# Patient Record
Sex: Female | Born: 1975 | Hispanic: No | Smoking: Never smoker
Health system: Southern US, Community
[De-identification: ages and names within clinical notes are randomized; demographics above are authoritative.]

## PROBLEM LIST (undated history)

## (undated) ENCOUNTER — Inpatient Hospital Stay (HOSPITAL_COMMUNITY): Payer: Self-pay

## (undated) DIAGNOSIS — Z789 Other specified health status: Secondary | ICD-10-CM

---

## 2015-03-20 ENCOUNTER — Inpatient Hospital Stay (HOSPITAL_COMMUNITY)
Admission: AD | Admit: 2015-03-20 | Discharge: 2015-03-21 | Disposition: A | Discharge status: Home / Self Care | Payer: Medicaid Other | Source: Ambulatory Visit | Attending: Obstetrics and Gynecology | Admitting: Obstetrics and Gynecology

## 2015-03-20 ENCOUNTER — Encounter (HOSPITAL_COMMUNITY): Payer: Self-pay | Admitting: *Deleted

## 2015-03-20 ENCOUNTER — Inpatient Hospital Stay (HOSPITAL_COMMUNITY): Payer: Medicaid Other

## 2015-03-20 DIAGNOSIS — O209 Hemorrhage in early pregnancy, unspecified: Secondary | ICD-10-CM | POA: Diagnosis present

## 2015-03-20 DIAGNOSIS — O4690 Antepartum hemorrhage, unspecified, unspecified trimester: Secondary | ICD-10-CM

## 2015-03-20 DIAGNOSIS — O034 Incomplete spontaneous abortion without complication: Secondary | ICD-10-CM | POA: Diagnosis not present

## 2015-03-20 HISTORY — DX: Other specified health status: Z78.9

## 2015-03-20 LAB — CBC
HCT: 34.5 % — ABNORMAL LOW (ref 36.0–46.0)
Hemoglobin: 12.2 g/dL (ref 12.0–15.0)
MCH: 31.5 pg (ref 26.0–34.0)
MCHC: 35.4 g/dL (ref 30.0–36.0)
MCV: 89.1 fL (ref 78.0–100.0)
Platelets: 186 10*3/uL (ref 150–400)
RBC: 3.87 MIL/uL (ref 3.87–5.11)
RDW: 12.4 % (ref 11.5–15.5)
WBC: 7.9 10*3/uL (ref 4.0–10.5)

## 2015-03-20 NOTE — MAU Provider Note (Signed)
History     CSN: [id number]  Arrival date and time: 03/20/15 2245   None     No chief complaint on file.  HPI 39 y.o. [id number]  presents by EMS with heavy bleeding. language Barrier. Interpreter by phone used to obtain history. Pt states she was in more pain earlier but it was better now.  No past medical history on file.  No past surgical history on file.  No family history on file.  History  Substance Use Topics  . Smoking status: Not on file  . Smokeless tobacco: Not on file  . Alcohol Use: Not on file    Allergies: Allergies not on file  No prescriptions prior to admission    Review of Systems  Genitourinary:       Heavy vaginal bleeding  All other systems reviewed and are negative.  Physical Exam   Blood pressure 123/71, pulse 82, temperature 98 F (36.7 C), temperature source Oral, resp. rate 18, last menstrual period 12/28/2014.  Physical Exam  Nursing note and vitals reviewed. Constitutional: She is oriented to person, place, and time. She appears well-developed and well-nourished. No distress.  HENT:  Head: Normocephalic.  Neck: Normal range of motion.  Cardiovascular: Normal rate.   Respiratory: Effort normal. No respiratory distress.  GI: Soft.  Genitourinary: There is bleeding in the vagina.  Musculoskeletal: Normal range of motion.  Neurological: She is alert and oriented to person, place, and time.  Skin: Skin is warm and dry.  Psychiatric: She has a normal mood and affect. Her behavior is normal. Judgment and thought content normal.   US Ob Comp Less 14 Wks  03/21/2015   CLINICAL DATA:  39 year old female with heavy vaginal bleeding in the first trimester of pregnancy. Pain. Estimated gestational age by LMP 11 weeks 5 days  EXAM: OBSTETRIC <14 WK [place] AND TRANSVAGINAL OB US  TECHNIQUE: Both transabdominal and transvaginal ultrasound examinations were performed for complete evaluation of the gestation as well as the maternal uterus, adnexal  regions, and pelvic cul-de-sac. Transvaginal technique was performed to assess early pregnancy.  COMPARISON:  None.  FINDINGS: Intrauterine gestational sac: Single, located at the lower uterine segment and cervix  Yolk sac:  Not visible  Embryo:  Visible  Cardiac Activity: Not detected. No vascularity detected with power Doppler.  MSD: 46.8  mm   10 w   4  d  CRL:  17.9  mm   8 w   2 d  Maternal uterus/adnexae: At adnexa not identified. No pelvic free fluid. No subchorionic hemorrhage.  IMPRESSION: Visible embryo but with absent cardiac activity and abnormally low position of the intrauterine gestational sac suspicious for spontaneous abortion in progress.   Electronically Signed   By: Odessa Fleming M.D.   On: 03/21/2015 00:18   US Ob Transvaginal  03/21/2015   CLINICAL DATA:  39 year old female with heavy vaginal bleeding in the first trimester of pregnancy. Pain. Estimated gestational age by LMP 11 weeks 5 days  EXAM: OBSTETRIC <14 WK [place] AND TRANSVAGINAL OB US  TECHNIQUE: Both transabdominal and transvaginal ultrasound examinations were performed for complete evaluation of the gestation as well as the maternal uterus, adnexal regions, and pelvic cul-de-sac. Transvaginal technique was performed to assess early pregnancy.  COMPARISON:  None.  FINDINGS: Intrauterine gestational sac: Single, located at the lower uterine segment and cervix  Yolk sac:  Not visible  Embryo:  Visible  Cardiac Activity: Not detected. No vascularity detected with power Doppler.  MSD: 46.8  mm  10 w   4  d  CRL:  17.9  mm   8 w   2 d  Maternal uterus/adnexae: At adnexa not identified. No pelvic free fluid. No subchorionic hemorrhage.  IMPRESSION: Visible embryo but with absent cardiac activity and abnormally low position of the intrauterine gestational sac suspicious for spontaneous abortion in progress.   Electronically Signed   By: Odessa Fleming M.D.   On: 03/21/2015 00:18   Results for orders placed or performed during the hospital encounter of  03/20/15 (from the past 24 hour(s))  CBC     Status: Abnormal   Collection Time: 03/20/15 11:25 PM  Result Value Ref Range   WBC 7.9 4.0 - 10.5 K/uL   RBC 3.87 3.87 - 5.11 MIL/uL   Hemoglobin 12.2 12.0 - 15.0 g/dL   HCT 29.9 (L) 24.2 - 68.3 %   MCV 89.1 78.0 - 100.0 fL   MCH 31.5 26.0 - 34.0 pg   MCHC 35.4 30.0 - 36.0 g/dL   RDW 41.9 62.2 - 29.7 %   Platelets 186 150 - 400 K/uL  ABO/Rh     Status: None (Preliminary result)   Collection Time: 03/20/15 11:25 PM  Result Value Ref Range   ABO/RH(D) O POS    MAU Course  Procedures  MDM Probable miscarriage; Removed 2 large clots from vagina and not actively bleeding. Labs ordered ; Ultrasound ordered. Pt is currently stable. Pt offered cytotec and is agreeable. Discussed POC with Dr Jolayne Panther. Early Intrauterine Pregnancy Failure Protocol  X Documented intrauterine pregnancy failure less than or equal to [redacted] weeks gestation  X No serious current illness  X Baseline Hgb greater than or equal to 10g/dl  X Patient has easily accessible transportation to the hospital  X Clear preference  X Practitioner/physician deems patient reliable  X Counseling by practitioner or physician  X Patient education by RN  X Consent form signed  Rho-Gam given by RN if indicated  X Medication dispensed  __ Cytotec 800 mcg Intravaginally by patient at home  X Buccally by NP in MAU  Rectally by patient at home  Rectally by RN in MAU  X Ibuprofen 600 mg 1 tablet by mouth every 6 hours as needed #30 - prescribed  X Tylenol #3 mg by mouth every 4 to 6 hours as needed - prescribed  _x_ Phenergan 12.5 mg by mouth every 4 hours as needed for nausea - prescribed     Assessment and Plan  Incomplete Spontaneous Abortion  Cytotec protocol Bleeding Precautions Discharge to home   Gina Li 03/20/2015, 11:12 PM

## 2015-03-20 NOTE — MAU Note (Signed)
Pt arrived via EMS with c/o vaginal bleeding. About 8-[redacted] week pregnant. Pt speaks African dialect. Friend to interpret and Phone interpretor on line as well.Pt had brief on and had moderate sized clot and active bleeding noted. Called L.Clemmons, CNM to bedside. Pt reports only having mild cramping now. Stated she dis pass something at home in toilet.

## 2015-03-21 LAB — HIV ANTIBODY (ROUTINE TESTING W REFLEX): HIV Screen 4th Generation wRfx: NONREACTIVE

## 2015-03-21 LAB — ABO/RH: ABO/RH(D): O POS

## 2015-03-21 LAB — HCG, QUANTITATIVE, PREGNANCY: hCG, Beta Chain, Quant, S: 2174 m[IU]/mL — ABNORMAL HIGH (ref <5)

## 2015-03-21 MED ORDER — IBUPROFEN 600 MG PO TABS: 600.0000 mg | ORAL_TABLET | Freq: Four times a day (QID) | ORAL | Status: DC | PRN

## 2015-03-21 MED ORDER — ACETAMINOPHEN-CODEINE #3 300-30 MG PO TABS: 1.0000 | ORAL_TABLET | ORAL | Status: DC | PRN

## 2015-03-21 MED ORDER — PROMETHAZINE HCL 25 MG PO TABS: 25.0000 mg | ORAL_TABLET | Freq: Four times a day (QID) | ORAL | Status: DC | PRN

## 2015-03-21 MED ORDER — MISOPROSTOL 200 MCG PO TABS
800.0000 ug | ORAL_TABLET | Freq: Once | ORAL | Status: AC
  Administered 2015-03-21: 800 ug via ORAL
  Filled 2015-03-21: qty 4

## 2015-03-21 NOTE — MAU Note (Signed)
Cytotec instructions given to pt and friend (interpertor) . Pt placed cytotec between her cheek and gum and let it dissolve. Gave follow up instructions and precautions to pt she verbalized understanding.

## 2015-03-21 NOTE — Discharge Instructions (Signed)
FACTS YOU SHOULD KNOW  WHAT IS AN EARLY PREGNANCY FAILURE? Once the egg is fertilized with the sperm and begins to develop, it attaches to the lining of the uterus. This early pregnancy tissue may not develop into an embryo (the beginning stage of a baby). Sometimes an embryo does develop but does not continue to grow. These problems can be seen on ultrasound.   MANAGEMNT OF EARLY PREGNANCY FAILURE: About 4 out of 100 (0.25%) women will have a pregnancy loss in her lifetime.  One in five pregnancies is found to be an early pregnancy failure.  There are 3 ways to care for an early pregnancy failure:   (1) Surgery, (2) Medicine, (3) Waiting for you to pass the pregnancy on your own. The decision as to how to proceed after being diagnosed with and early pregnancy failure is an individual one.  The decision can be made only after appropriate counseling.  You need to weigh the pros and cons of the 3 choices. Then you can make the choice that works for you. SURGERY (D&E) . Procedure over in 1 day . Requires being put to sleep . Bleeding may be light . Possible problems during surgery, including injury to womb(uterus) . Care provider has more control Medicine (CYTOTEC) . The complete procedure may take days to weeks . No Surgery . Bleeding may be heavy at times . There may be drug side effects . Patient has more control Waiting . You may choose to wait, in which case your own body may complete the passing of the abnormal early pregnancy on its own in about 2-4 weeks . Your bleeding may be heavy at times . There is a small possibility that you may need surgery if the bleeding is too much or not all of the pregnancy has passed. CYTOTEC MANAGEMENT Prostaglandins (cytotec) are the most widely used drug for this purpose. They cause the uterus to cramp and contract. You will place the medicine yourself inside your vagina in the privacy of your home. Empting of the uterus should occur within 3 days but  the process may continue for several weeks. The bleeding may seem heavy at times. POSSIBLE SIDE EFFECTS FROM CYTOTEC . Nausea   Vomiting . Diarrhea Fever . Chills  Hot Flashes Side effects  from the process of the early pregnancy failure include: . Cramping  Bleeding . Headaches  Dizziness RISKS: This is a low risk procedure. Less than 1 in 100 women has a complication. An incomplete passage of the early pregnancy may occur. Also, Hemorrhage (heavy bleeding) could happen.  Rarely the pregnancy will not be passed completely. Excessively heavy bleeding may occur.  Your doctor may need to perform surgery to empty the uterus (D&E). Afterwards: Everybody will feel differently after the early pregnancy completion. You may have soreness or cramps for a day or two. You may have soreness or cramps for day or two.  You may have light bleeding for up to 2 weeks. You may be as active as you feel like being. If you have any of the following problems you may call [facility]missions [facility] at [phone]. . If you have pain that does not get better  with pain medication . Bleeding that soaks through 2 thick full-sized sanitary pads in an hour . Cramps that last longer than 2 days . Foul smelling discharge . Fever above 100.4 degrees F Even if you do not have any of these symptoms, you should have a follow-up exam to make sure you   are healing properly. This appointment will be made for you before you leave the hospital. Your next normal period will start again in 4-6 week after the loss. You can get pregnant soon after the loss, so use birth control right away. Finally: Make sure all your questions are answered before during and after any procedure. Follow up with medical care and family planning methods.     Incomplete Miscarriage A miscarriage is the sudden loss of an unborn baby (fetus) before the 20th week of pregnancy. In an incomplete miscarriage, parts of the fetus or placenta (afterbirth) remain in  the body.  Having a miscarriage can be an emotional experience. Talk with your health care provider about any questions you may have about miscarrying, the grieving process, and your future pregnancy plans. CAUSES   Problems with the fetal chromosomes that make it impossible for the baby to develop normally. Problems with the baby's genes or chromosomes are most often the result of errors that occur by chance as the embryo divides and grows. The problems are not inherited from the parents.  Infection of the cervix or uterus.  Hormone problems.  Problems with the cervix, such as having an incompetent cervix. This is when the tissue in the cervix is not strong enough to hold the pregnancy.  Problems with the uterus, such as an abnormally shaped uterus, uterine fibroids, or congenital abnormalities.  Certain medical conditions.  Smoking, drinking alcohol, or taking illegal drugs.  Trauma. SYMPTOMS   Vaginal bleeding or spotting, with or without cramps or pain.  Pain or cramping in the abdomen or lower back.  Passing fluid, tissue, or blood clots from the vagina. DIAGNOSIS  Your health care provider will perform a physical exam. You may also have an ultrasound to confirm the miscarriage. Blood or urine tests may also be ordered. TREATMENT   Usually, a dilation and curettage (D&C) procedure is performed. During a D&C procedure, the cervix is widened (dilated) and any remaining fetal or placental tissue is gently removed from the uterus.  Antibiotic medicines are prescribed if there is an infection. Other medicines may be given to reduce the size of the uterus (contract) if there is a lot of bleeding.  If you have Rh negative blood and your baby was Rh positive, you will need a Rho (D) immune globulin shot. This shot will protect any future baby from having Rh blood problems in future pregnancies.  You may be confined to bed rest. This means you should stay in bed and only get up to use  the bathroom. HOME CARE INSTRUCTIONS   Rest as directed by your health care provider.  Restrict activity as directed by your health care provider. You may be allowed to continue light activity if curettage was not done but you require further treatment.  Keep track of the number of pads you use each day. Keep track of how soaked (saturated) they are. Record this information.  Do not  use tampons.  Do not douche or have sexual intercourse until approved by your health care provider.  Keep all follow-up appointments for reevaluation and continuing management.  Only take over-the-counter or prescription medicines for pain, fever, or discomfort as directed by your health care provider.  Take antibiotic medicine as directed by your health care provider. Make sure you finish it even if you start to feel better. SEEK IMMEDIATE MEDICAL CARE IF:   You experience severe cramps in your stomach, back, or abdomen.  You have an unexplained temperature (make  sure to record these temperatures).  You pass large clots or tissue (save these for your health care provider to inspect).  Your bleeding increases.  You become light-headed, weak, or have fainting episodes. MAKE SURE YOU:   Understand these instructions.  Will watch your condition.  Will get help right away if you are not doing well or get worse. Document Released: 09/18/2005 Document Revised: 02/02/2014 Document Reviewed: 04/17/2013 [facility] Patient Information 2015 [place], [place]. This information is not intended to replace advice given to you by your health care provider. Make sure you discuss any questions you have with your health care provider.

## 2015-04-08 ENCOUNTER — Ambulatory Visit (INDEPENDENT_AMBULATORY_CARE_PROVIDER_SITE_OTHER): Payer: Medicaid Other | Admitting: Obstetrics & Gynecology

## 2015-04-08 ENCOUNTER — Encounter: Payer: Self-pay | Admitting: Obstetrics & Gynecology

## 2015-04-08 VITALS — BP 138/82 | HR 89 | Wt 160.0 lb

## 2015-04-08 DIAGNOSIS — N949 Unspecified condition associated with female genital organs and menstrual cycle: Secondary | ICD-10-CM | POA: Diagnosis not present

## 2015-04-08 DIAGNOSIS — R102 Pelvic and perineal pain: Secondary | ICD-10-CM

## 2015-04-08 DIAGNOSIS — O021 Missed abortion: Secondary | ICD-10-CM | POA: Diagnosis not present

## 2015-04-08 MED ORDER — IBUPROFEN 600 MG PO TABS: 600.0000 mg | ORAL_TABLET | Freq: Four times a day (QID) | ORAL | Status: DC | PRN

## 2015-04-08 NOTE — Patient Instructions (Signed)
Contraception Choices Contraception (birth control) is the use of any methods or devices to prevent pregnancy. Below are some methods to help avoid pregnancy. HORMONAL METHODS   Contraceptive implant. This is a thin, plastic tube containing progesterone hormone. It does not contain estrogen hormone. Your health care provider inserts the tube in the inner part of the upper arm. The tube can remain in place for up to 3 years. After 3 years, the implant must be removed. The implant prevents the ovaries from releasing an egg (ovulation), thickens the cervical mucus to prevent sperm from entering the uterus, and thins the lining of the inside of the uterus.  Progesterone-only injections. These injections are given every 3 months by your health care provider to prevent pregnancy. This synthetic progesterone hormone stops the ovaries from releasing eggs. It also thickens cervical mucus and changes the uterine lining. This makes it harder for sperm to survive in the uterus.  Birth control pills. These pills contain estrogen and progesterone hormone. They work by preventing the ovaries from releasing eggs (ovulation). They also cause the cervical mucus to thicken, preventing the sperm from entering the uterus. Birth control pills are prescribed by a health care provider.Birth control pills can also be used to treat heavy periods.  Minipill. This type of birth control pill contains only the progesterone hormone. They are taken every day of each month and must be prescribed by your health care provider.  Birth control patch. The patch contains hormones similar to those in birth control pills. It must be changed once a week and is prescribed by a health care provider.  Vaginal ring. The ring contains hormones similar to those in birth control pills. It is left in the vagina for 3 weeks, removed for 1 week, and then a new one is put back in place. The patient must be comfortable inserting and removing the ring  from the vagina.A health care provider's prescription is necessary.  Emergency contraception. Emergency contraceptives prevent pregnancy after unprotected sexual intercourse. This pill can be taken right after sex or up to 5 days after unprotected sex. It is most effective the sooner you take the pills after having sexual intercourse. Most emergency contraceptive pills are available without a prescription. Check with your pharmacist. Do not use emergency contraception as your only form of birth control. BARRIER METHODS   Female condom. This is a thin sheath (latex or rubber) that is worn over the penis during sexual intercourse. It can be used with spermicide to increase effectiveness.  Female condom. This is a soft, loose-fitting sheath that is put into the vagina before sexual intercourse.  Diaphragm. This is a soft, latex, dome-shaped barrier that must be fitted by a health care provider. It is inserted into the vagina, along with a spermicidal jelly. It is inserted before intercourse. The diaphragm should be left in the vagina for 6 to 8 hours after intercourse.  Cervical cap. This is a round, soft, latex or plastic cup that fits over the cervix and must be fitted by a health care provider. The cap can be left in place for up to 48 hours after intercourse.  Sponge. This is a soft, circular piece of polyurethane foam. The sponge has spermicide in it. It is inserted into the vagina after wetting it and before sexual intercourse.  Spermicides. These are chemicals that kill or block sperm from entering the cervix and uterus. They come in the form of creams, jellies, suppositories, foam, or tablets. They do not require a   prescription. They are inserted into the vagina with an applicator before having sexual intercourse. The process must be repeated every time you have sexual intercourse. INTRAUTERINE CONTRACEPTION  Intrauterine device (IUD). This is a T-shaped device that is put in a woman's uterus  during a menstrual period to prevent pregnancy. There are 2 types:  Copper IUD. This type of IUD is wrapped in copper wire and is placed inside the uterus. Copper makes the uterus and fallopian tubes produce a fluid that kills sperm. It can stay in place for 10 years.  Hormone IUD. This type of IUD contains the hormone progestin (synthetic progesterone). The hormone thickens the cervical mucus and prevents sperm from entering the uterus, and it also thins the uterine lining to prevent implantation of a fertilized egg. The hormone can weaken or kill the sperm that get into the uterus. It can stay in place for 3-5 years, depending on which type of IUD is used. PERMANENT METHODS OF CONTRACEPTION  Female tubal ligation. This is when the woman's fallopian tubes are surgically sealed, tied, or blocked to prevent the egg from traveling to the uterus.  Hysteroscopic sterilization. This involves placing a small coil or insert into each fallopian tube. Your doctor uses a technique called hysteroscopy to do the procedure. The device causes scar tissue to form. This results in permanent blockage of the fallopian tubes, so the sperm cannot fertilize the egg. It takes about 3 months after the procedure for the tubes to become blocked. You must use another form of birth control for these 3 months.  Female sterilization. This is when the female has the tubes that carry sperm tied off (vasectomy).This blocks sperm from entering the vagina during sexual intercourse. After the procedure, the man can still ejaculate fluid (semen). NATURAL PLANNING METHODS  Natural family planning. This is not having sexual intercourse or using a barrier method (condom, diaphragm, cervical cap) on days the woman could become pregnant.  Calendar method. This is keeping track of the length of each menstrual cycle and identifying when you are fertile.  Ovulation method. This is avoiding sexual intercourse during ovulation.  Symptothermal  method. This is avoiding sexual intercourse during ovulation, using a thermometer and ovulation symptoms.  Post-ovulation method. This is timing sexual intercourse after you have ovulated. Regardless of which type or method of contraception you choose, it is important that you use condoms to protect against the transmission of sexually transmitted infections (STIs). Talk with your health care provider about which form of contraception is most appropriate for you. Document Released: 09/18/2005 Document Revised: 09/23/2013 Document Reviewed: 03/13/2013 [employer] Patient Information 2015 [employer]. This information is not intended to replace advice given to you by your health care provider. Make sure you discuss any questions you have with your health care provider.  

## 2015-04-08 NOTE — Progress Notes (Signed)
Patient ID: Gina Li, female   DOB: 11-Nov-1975, 39 y.o.   MRN: [id number] History:  39 y.o. [id number] here today for f/u missed AB at 8 weeks.  Pt took cytotec March 21, 2015.  She reports that she took it that night and the bleeding stopped after passage of a large clot then the bleeding stopped.  She denies bleeding since at least 1 1/2 weeks prev.  Pt has mild abd pain still but has not taken any meds for it.  She reports that this feels like gas pain and is different from the pain assoc with the SAB.  The following portions of the patient's history were reviewed and updated as appropriate: allergies, current medications, past family history, past medical history, past social history, past surgical history and problem list.  Review of Systems:  A comprehensive review of systems was negative.  Objective:  Physical Exam Blood pressure 138/82, pulse 89, weight 160 lb (72.576 kg), last menstrual period 12/28/2014, unknown if currently breastfeeding. Gen: NAD Abd: Soft, nontender and nondistended; no rebound or guarding.  Well healed vertical skin incision.   Labs and Imaging [place] Ob Comp Less 14 Wks  03/21/2015   CLINICAL DATA:  39 year old female with heavy vaginal bleeding in the first trimester of pregnancy. Pain. Estimated gestational age by LMP 11 weeks 5 days  EXAM: OBSTETRIC <14 WK US AND TRANSVAGINAL OB US  TECHNIQUE: Both transabdominal and transvaginal ultrasound examinations were performed for complete evaluation of the gestation as well as the maternal uterus, adnexal regions, and pelvic cul-de-sac. Transvaginal technique was performed to assess early pregnancy.  COMPARISON:  None.  FINDINGS: Intrauterine gestational sac: Single, located at the lower uterine segment and cervix  Yolk sac:  Not visible  Embryo:  Visible  Cardiac Activity: Not detected. No vascularity detected with power Doppler.  MSD: 46.8  mm   10 w   4  d  CRL:  17.9  mm   8 w   2 d  Maternal uterus/adnexae: At  adnexa not identified. No pelvic free fluid. No subchorionic hemorrhage.  IMPRESSION: Visible embryo but with absent cardiac activity and abnormally low position of the intrauterine gestational sac suspicious for spontaneous abortion in progress.   Electronically Signed   By: Odessa FlemingH  Hall M.D.   On: 03/21/2015 00:18   [place] Ob Transvaginal  03/21/2015   CLINICAL DATA:  39 year old female with heavy vaginal bleeding in the first trimester of pregnancy. Pain. Estimated gestational age by LMP 11 weeks 5 days  EXAM: OBSTETRIC <14 WK US AND TRANSVAGINAL OB US  TECHNIQUE: Both transabdominal and transvaginal ultrasound examinations were performed for complete evaluation of the gestation as well as the maternal uterus, adnexal regions, and pelvic cul-de-sac. Transvaginal technique was performed to assess early pregnancy.  COMPARISON:  None.  FINDINGS: Intrauterine gestational sac: Single, located at the lower uterine segment and cervix  Yolk sac:  Not visible  Embryo:  Visible  Cardiac Activity: Not detected. No vascularity detected with power Doppler.  MSD: 46.8  mm   10 w   4  d  CRL:  17.9  mm   8 w   2 d  Maternal uterus/adnexae: At adnexa not identified. No pelvic free fluid. No subchorionic hemorrhage.  IMPRESSION: Visible embryo but with absent cardiac activity and abnormally low position of the intrauterine gestational sac suspicious for spontaneous abortion in progress.   Electronically Signed   By: Odessa FlemingH  Hall M.D.   On: 03/21/2015 00:18    Assessment &  Plan:  S/p cytotec for Missed AB. undesired fertility- reviewed contraceptive options. Pt will use abstinence Motrin  po prn  F/u prn

## 2016-07-05 ENCOUNTER — Ambulatory Visit: Payer: Medicaid Other | Admitting: Internal Medicine

## 2016-10-15 IMAGING — US US OB COMP LESS 14 WK
1 series · 14 of 28 positions shown · non-contrast
Comparison: None.

CLINICAL DATA: 38-year-old female with heavy vaginal bleeding in
the first trimester of pregnancy. Pain. Estimated gestational age by
LMP 11 weeks 5 days

EXAM:
OBSTETRIC <14 WK US AND TRANSVAGINAL OB US
TECHNIQUE: Both transabdominal and transvaginal ultrasound examinations were
performed for complete evaluation of the gestation as well as the
maternal uterus, adnexal regions, and pelvic cul-de-sac.
Transvaginal technique was performed to assess early pregnancy.

[Series 1: us ob comp less 14 wk · 31 acquisitions, 14 frames shown]
[im 2/31]
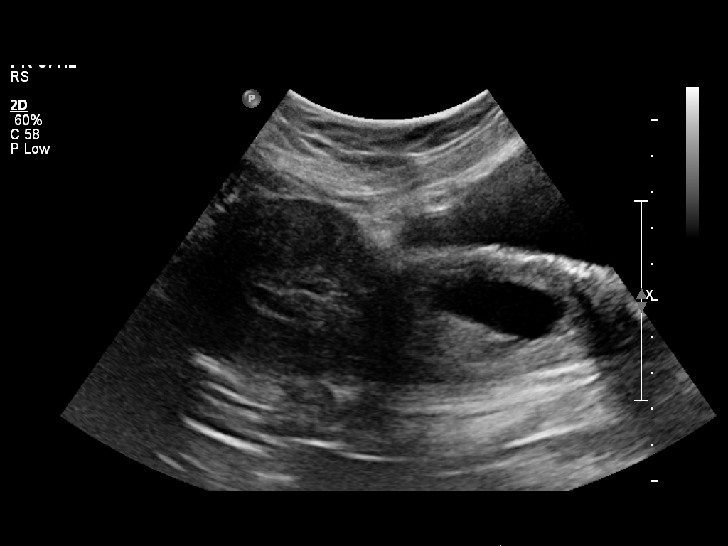
[im 4/31]
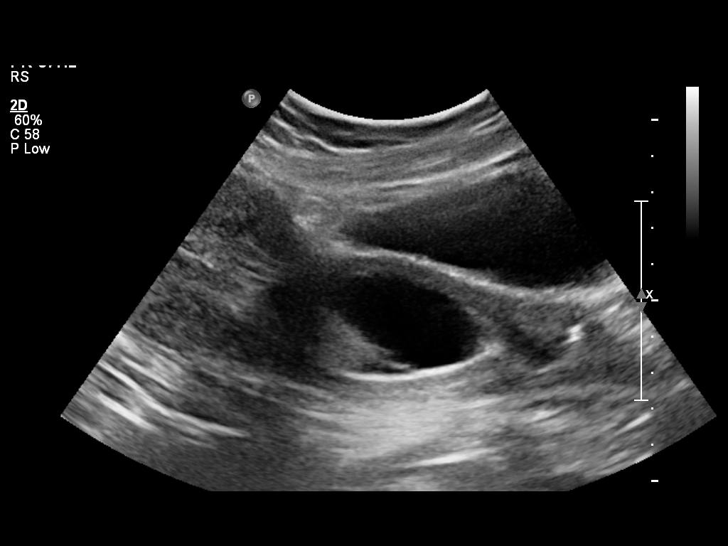
[im 6/31]
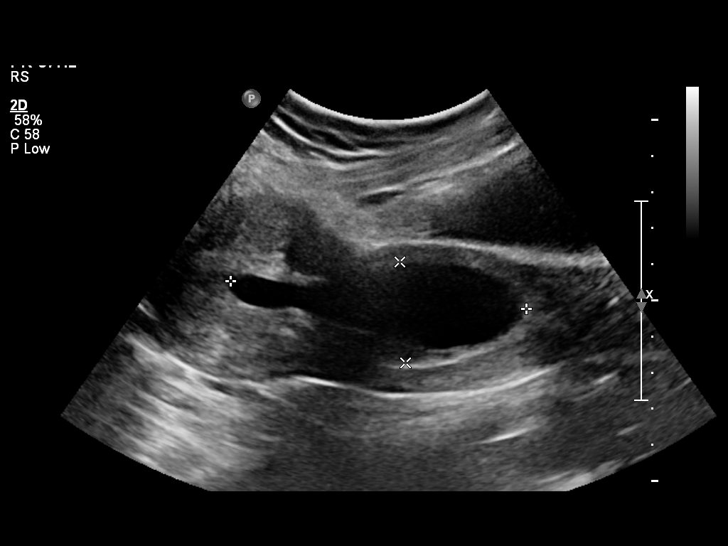
[im 8/31]
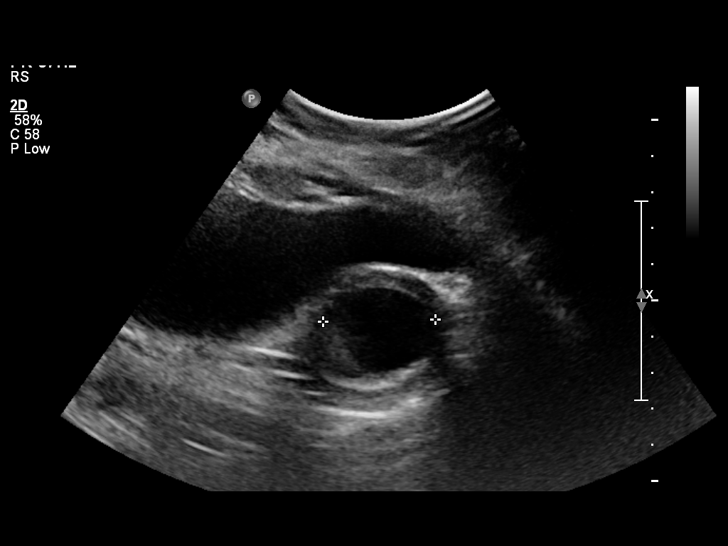
[im 11/31]
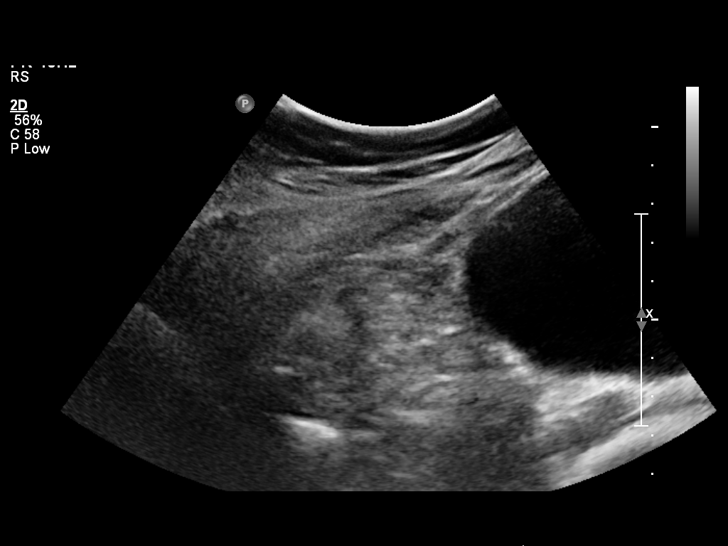
[im 13/31]
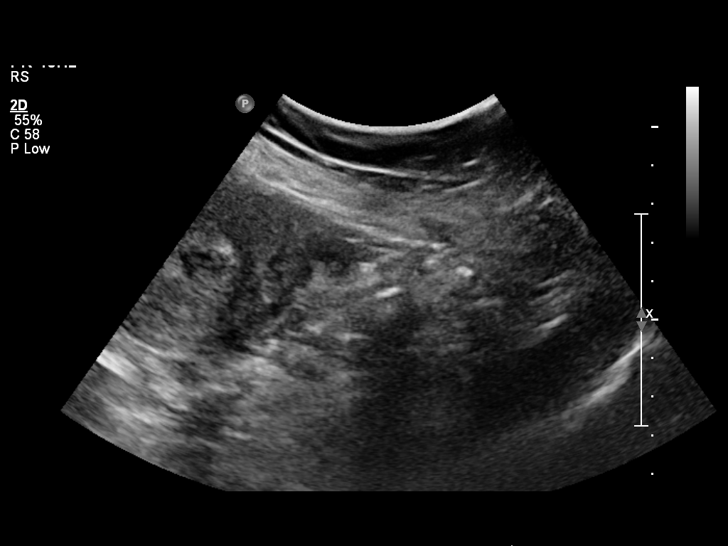
[im 15/31]
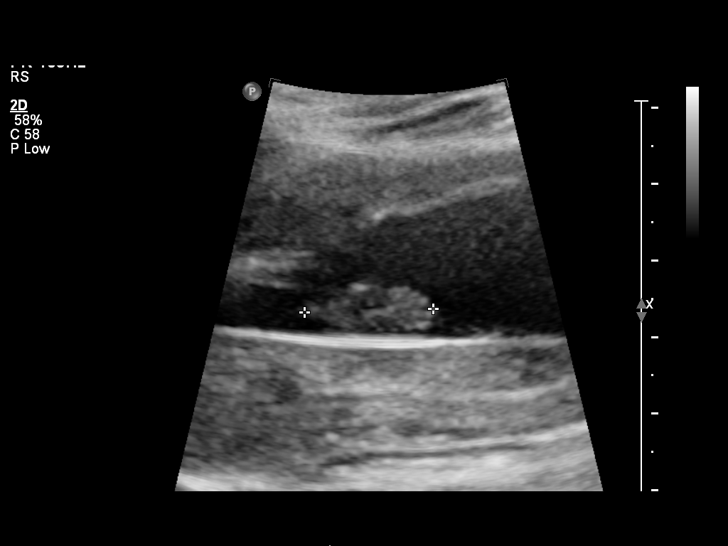
[im 17/31]
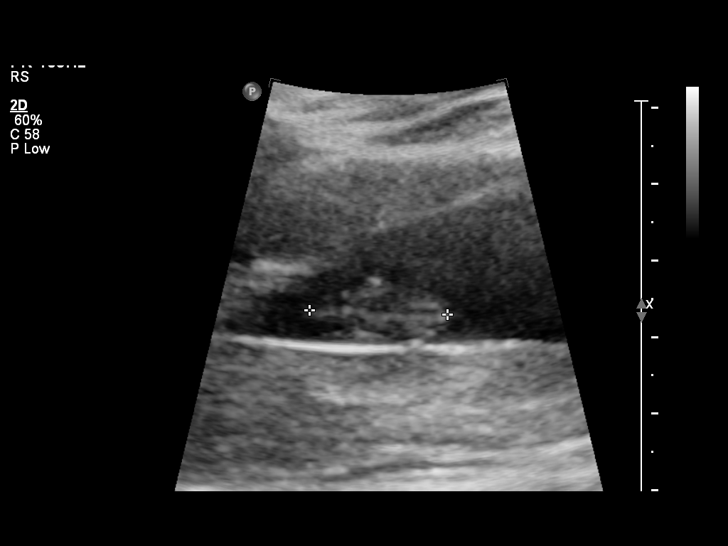
[im 19/31]
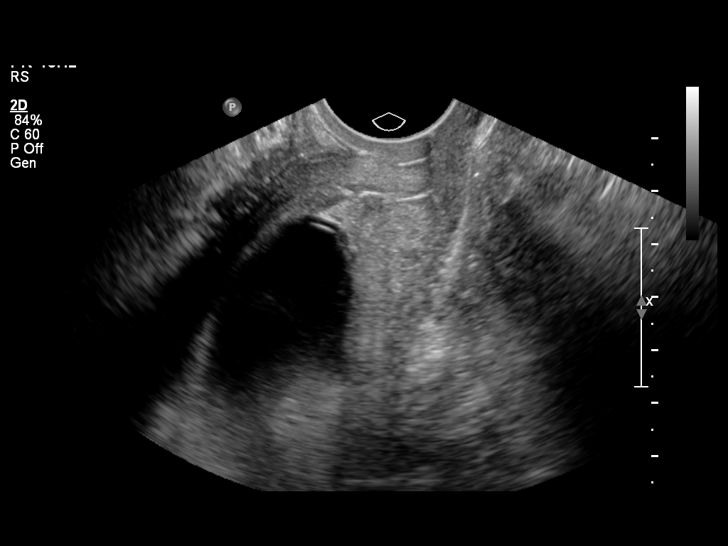
[im 22/31]
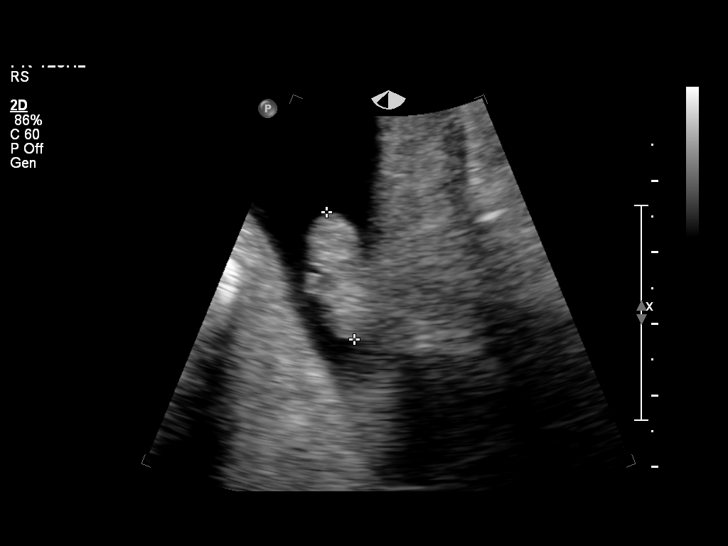
[im 24/31]
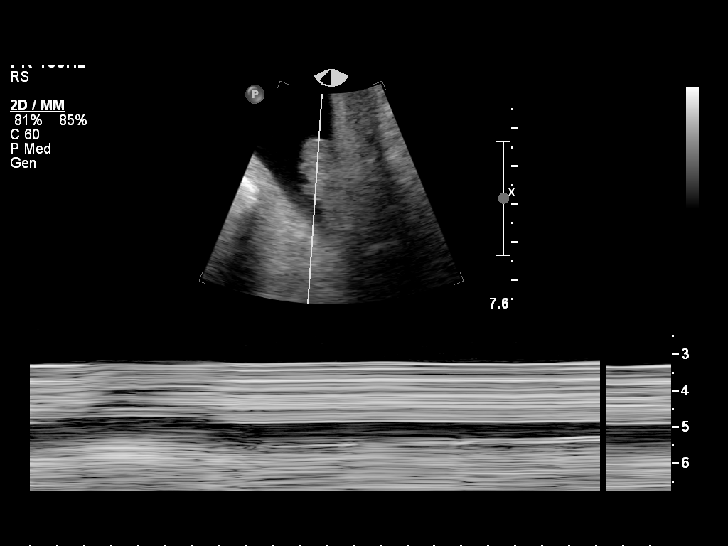
[im 26/31]
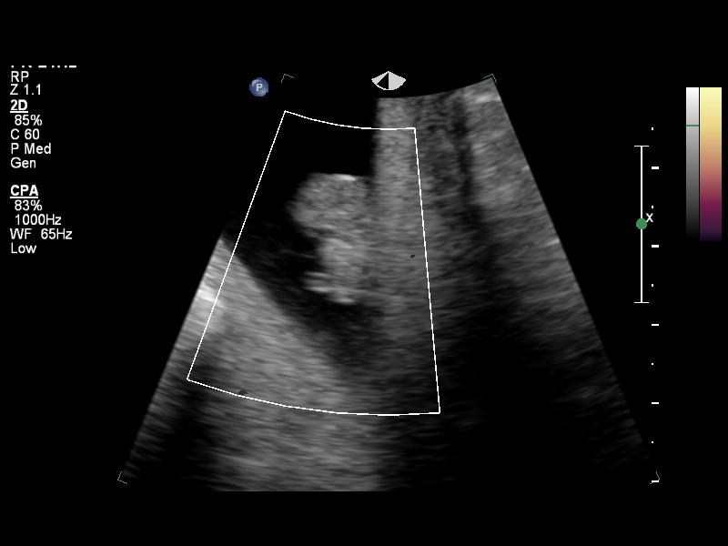
[im 28/31]
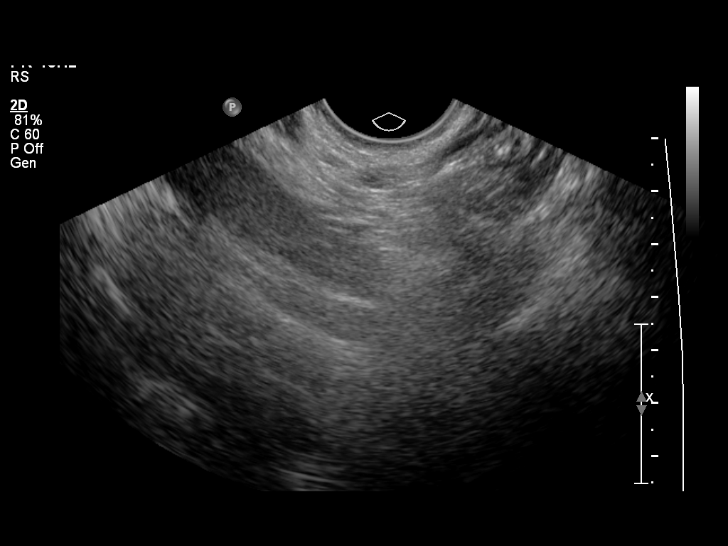
[im 31/31]
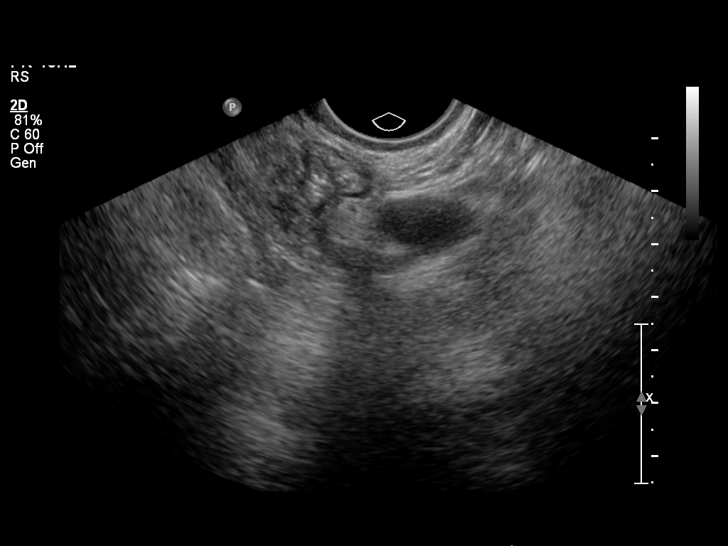

[14 of 28 positions shown; findings below may reference images not displayed]

FINDINGS: Intrauterine gestational sac: Single, located at the lower uterine
segment and cervix

Yolk sac:  Not visible

Embryo:  Visible

Cardiac Activity: Not detected. No vascularity detected with power
Doppler.

MSD: 46.8  mm   10 w   4  d

CRL:  17.9  mm   8 w   2 d

Maternal uterus/adnexae: At adnexa not identified. No pelvic free
fluid. No subchorionic hemorrhage.
IMPRESSION: Visible embryo but with absent cardiac activity and abnormally low
position of the intrauterine gestational sac suspicious for
spontaneous abortion in progress.

## 2019-10-08 ENCOUNTER — Encounter (HOSPITAL_COMMUNITY): Payer: Self-pay

## 2019-10-08 ENCOUNTER — Other Ambulatory Visit: Payer: Self-pay

## 2019-10-08 ENCOUNTER — Ambulatory Visit (HOSPITAL_COMMUNITY)
Admission: EM | Admit: 2019-10-08 | Discharge: 2019-10-08 | Disposition: A | Discharge status: Home / Self Care | Payer: BC Managed Care – PPO | Attending: Urgent Care | Admitting: Urgent Care

## 2019-10-08 DIAGNOSIS — R059 Cough, unspecified: Secondary | ICD-10-CM

## 2019-10-08 DIAGNOSIS — Z20822 Contact with and (suspected) exposure to covid-19: Secondary | ICD-10-CM

## 2019-10-08 DIAGNOSIS — B349 Viral infection, unspecified: Secondary | ICD-10-CM | POA: Diagnosis not present

## 2019-10-08 DIAGNOSIS — R05 Cough: Secondary | ICD-10-CM

## 2019-10-08 DIAGNOSIS — R509 Fever, unspecified: Secondary | ICD-10-CM

## 2019-10-08 DIAGNOSIS — R0981 Nasal congestion: Secondary | ICD-10-CM

## 2019-10-08 DIAGNOSIS — R030 Elevated blood-pressure reading, without diagnosis of hypertension: Secondary | ICD-10-CM

## 2019-10-08 MED ORDER — PROMETHAZINE-DM 6.25-15 MG/5ML PO SYRP: 5.0000 mL | ORAL_SOLUTION | Freq: Every evening | ORAL | 0 refills | Status: DC | PRN

## 2019-10-08 MED ORDER — BENZONATATE 100 MG PO CAPS: 100.0000 mg | ORAL_CAPSULE | Freq: Three times a day (TID) | ORAL | 0 refills | Status: DC | PRN

## 2019-10-08 MED ORDER — PSEUDOEPHEDRINE HCL 30 MG PO TABS: 30.0000 mg | ORAL_TABLET | Freq: Three times a day (TID) | ORAL | 0 refills | Status: DC | PRN

## 2019-10-08 MED ORDER — CETIRIZINE HCL 10 MG PO TABS: 10.0000 mg | ORAL_TABLET | Freq: Every day | ORAL | 0 refills | Status: DC

## 2019-10-08 NOTE — ED Triage Notes (Signed)
Pt states she has fever , cough and headaches 3 days. Pt state she was tested on Monday at work for [employer]. Pt will get her results tomorrow.

## 2019-10-08 NOTE — Discharge Instructions (Addendum)
We will manage this as a viral syndrome. For sore throat or cough try using a honey-based tea. Use 3 teaspoons of honey with juice squeezed from half lemon. Place shaved pieces of ginger into 1/2-1 cup of water and warm over stove top. Then mix the ingredients and repeat every 4 hours as needed. Please take Tylenol 500mg every 6 hours. Hydrate very well with at least 2 liters of water. Eat light meals such as soups to replenish electrolytes and soft fruits, veggies. Start an antihistamine like Zyrtec (cetirizine) at 10mg daily for postnasal drainage, sinus congestion.  You can take this together with pseudoephedrine (Sudafed) at a dose of 30 mg 3 times a day or twice daily as needed for the same kind of congestion.   

## 2019-10-08 NOTE — ED Provider Notes (Signed)
[place]   MRN: [id number] DOB: Mar 15, 1976  Subjective:   Gina Li is a 44 y.o. female presenting for 3-day history of acute onset moderate persistent and worsening cough, malaise, headaches.  She was tested for COVID-19 on Monday and is anticipating her results.  Has not taken any medications for relief.  Denies history of asthma, COPD.  Denies any drug use.  No current facility-administered medications for this encounter.  Current Outpatient Medications:  .  ibuprofen (ADVIL,MOTRIN) 600 MG tablet, Take 1 tablet (600 mg total) by mouth every 6 (six) hours as needed for moderate pain or cramping., Disp: 30 tablet, Rfl: 0   No Known Allergies  Past Medical History:  Diagnosis Date  . Medical history non-contributory      Past Surgical History:  Procedure Laterality Date  . CESAREAN SECTION     x3    History reviewed. No pertinent family history.  Social History   Tobacco Use  . Smoking status: Never Smoker  . Smokeless tobacco: Never Used  Substance Use Topics  . Alcohol use: No  . Drug use: No    Review of Systems  Constitutional: Positive for fever and malaise/fatigue.  HENT: Negative for congestion, ear pain, sinus pain and sore throat.   Eyes: Negative for discharge and redness.  Respiratory: Positive for cough. Negative for hemoptysis, shortness of breath and wheezing.   Cardiovascular: Negative for chest pain.  Gastrointestinal: Negative for abdominal pain, diarrhea, nausea and vomiting.  Genitourinary: Negative for dysuria, flank pain and hematuria.  Musculoskeletal: Negative for myalgias.  Skin: Negative for rash.  Neurological: Positive for headaches. Negative for dizziness and weakness.  Psychiatric/Behavioral: Negative for depression and substance abuse.     Objective:   Vitals: BP (!) 148/97 (BP Location: Right Arm)   Pulse 74   Temp 98.8 F (37.1 C)   Resp 18   Wt 230 lb (104.3 kg)   LMP 10/08/2019   SpO2 100%    Physical Exam Constitutional:      General: She is not in acute distress.    Appearance: Normal appearance. She is well-developed. She is not ill-appearing, toxic-appearing or diaphoretic.  HENT:     Head: Normocephalic and atraumatic.     Nose: Nose normal.     Mouth/Throat:     Mouth: Mucous membranes are moist.  Eyes:     Extraocular Movements: Extraocular movements intact.     Pupils: Pupils are equal, round, and reactive to light.  Cardiovascular:     Rate and Rhythm: Normal rate and regular rhythm.     Pulses: Normal pulses.     Heart sounds: Normal heart sounds. No murmur. No friction rub. No gallop.   Pulmonary:     Effort: Pulmonary effort is normal. No respiratory distress.     Breath sounds: Normal breath sounds. No stridor. No wheezing, rhonchi or rales.  Skin:    General: Skin is warm and dry.     Findings: No rash.  Neurological:     Mental Status: She is alert and oriented to person, place, and time.  Psychiatric:        Mood and Affect: Mood normal.        Behavior: Behavior normal.        Thought Content: Thought content normal.        Judgment: Judgment normal.      Assessment and Plan :   1. Viral illness   2. Sinus congestion   3. Cough   4.  Fever, unspecified   5. Exposure to COVID-19 virus   6. Elevated blood pressure reading without diagnosis of hypertension     Will manage for viral illness such as viral URI, viral rhinitis, possible COVID-19. Counseled patient on nature of COVID-19 including modes of transmission, diagnostic testing, management and supportive care.  Offered symptomatic relief. COVID 19 testing is pending. Counseled patient on potential for adverse effects with medications prescribed/recommended today, ER and return-to-clinic precautions discussed, patient verbalized understanding.     Wallis Bamberg, [place] 10/09/19 [phone]

## 2019-10-17 ENCOUNTER — Ambulatory Visit (HOSPITAL_COMMUNITY)
Admission: EM | Admit: 2019-10-17 | Discharge: 2019-10-17 | Disposition: A | Discharge status: Home / Self Care | Payer: BC Managed Care – PPO | Attending: Family Medicine | Admitting: Family Medicine

## 2019-10-17 ENCOUNTER — Encounter (HOSPITAL_COMMUNITY): Payer: Self-pay

## 2019-10-17 ENCOUNTER — Other Ambulatory Visit: Payer: Self-pay

## 2019-10-17 ENCOUNTER — Ambulatory Visit (INDEPENDENT_AMBULATORY_CARE_PROVIDER_SITE_OTHER): Payer: BC Managed Care – PPO

## 2019-10-17 DIAGNOSIS — U071 COVID-19: Secondary | ICD-10-CM

## 2019-10-17 DIAGNOSIS — R52 Pain, unspecified: Secondary | ICD-10-CM

## 2019-10-17 DIAGNOSIS — Z0289 Encounter for other administrative examinations: Secondary | ICD-10-CM

## 2019-10-17 DIAGNOSIS — R102 Pelvic and perineal pain: Secondary | ICD-10-CM

## 2019-10-17 DIAGNOSIS — O021 Missed abortion: Secondary | ICD-10-CM

## 2019-10-17 MED ORDER — IBUPROFEN 600 MG PO TABS: 600.0000 mg | ORAL_TABLET | Freq: Three times a day (TID) | ORAL | 0 refills | Status: DC | PRN

## 2019-10-17 NOTE — Discharge Instructions (Signed)
Your x-ray was normal Is most likely continued by aches from the virus. Sending in ibuprofen to take for pain.  600 mg every 8 hours. Work note extended Follow up as needed for continued or worsening symptoms

## 2019-10-17 NOTE — ED Triage Notes (Addendum)
Pt presents to [facility] for work letter to return to work. Pt states she was told she can return to work on 10/16/2018. Pt tested positive for COVID on 10/09/2018. Pt is having headaches and sore throat x 10 days aprox.

## 2019-10-19 NOTE — ED Provider Notes (Signed)
[facility]    CSN: [id number] Arrival date & time: 10/17/19  1208      History   Chief Complaint Chief Complaint  Patient presents with  . Letter for School/Work    + COVID  . Sore Throat  . Headache    HPI Gina Li is a 44 y.o. female.   Patient is a 44 year old female that presents today for extension of work note.  Reporting was supposed to return to work on 10/16/2018.  She tested positive for Covid on 10/09/2018.  Reports she is still having sore throat, headaches, body aches and low-grade fever.  She is currently not take anything for pain.  No chest pain or shortness of breath.  Mild cough  ROS per HPI    Sore Throat Associated symptoms include headaches.  Headache   Past Medical History:  Diagnosis Date  . Medical history non-contributory     There are no problems to display for this patient.   Past Surgical History:  Procedure Laterality Date  . CESAREAN SECTION     x3    OB History    Gravida  6   Para  4   Term  3   Preterm  1   AB  2   Living        SAB  2   TAB      Ectopic      Multiple      Live Births               Home Medications    Prior to Admission medications   Medication Sig Start Date End Date Taking? Authorizing Provider  benzonatate (TESSALON) 100 MG capsule Take 1-2 capsules (100-200 mg total) by mouth 3 (three) times daily as needed. 10/08/19   Wallis Bamberg, PA-C  cetirizine (ZYRTEC ALLERGY) 10 MG tablet Take 1 tablet (10 mg total) by mouth daily. 10/08/19   Wallis Bamberg, PA-C  ibuprofen (ADVIL) 600 MG tablet Take 1 tablet (600 mg total) by mouth every 8 (eight) hours as needed for moderate pain. 10/17/19   Dahlia Byes A, NP  promethazine-dextromethorphan (PROMETHAZINE-DM) 6.25-15 MG/5ML syrup Take 5 mLs by mouth at bedtime as needed for cough. 10/08/19   Wallis Bamberg, PA-C  pseudoephedrine (SUDAFED) 30 MG tablet Take 1 tablet (30 mg total) by mouth every 8 (eight) hours as needed for congestion.  10/08/19   Wallis Bamberg, PA-C  promethazine (PHENERGAN) 25 MG tablet Take 1 tablet (25 mg total) by mouth every 6 (six) hours as needed for nausea or vomiting. Patient not taking: Reported on 04/08/2015 03/21/15 10/08/19  Clemmons, Elmore Guise, CNM    Family History History reviewed. No pertinent family history.  Social History Social History   Tobacco Use  . Smoking status: Never Smoker  . Smokeless tobacco: Never Used  Substance Use Topics  . Alcohol use: No  . Drug use: No     Allergies   Patient has no known allergies.   Review of Systems Review of Systems  Neurological: Positive for headaches.     Physical Exam Triage Vital Signs ED Triage Vitals  Enc Vitals Group     BP 10/17/19 1256 (!) 149/78     Pulse --      Resp 10/17/19 1256 (!) 21     Temp 10/17/19 1256 97.7 F (36.5 C)     Temp Source 10/17/19 1256 Oral     SpO2 10/17/19 1256 99 %     Weight --  Height --      Head Circumference --      Peak Flow --      Pain Score 10/17/19 1252 4     Pain Loc --      Pain Edu? --      Excl. in GC? --    No data found.  Updated Vital Signs BP (!) 149/78 (BP Location: Right Arm)   Temp 97.7 F (36.5 C) (Oral)   Resp (!) 21   LMP 10/08/2019   SpO2 99%   Visual Acuity Right Eye Distance:   Left Eye Distance:   Bilateral Distance:    Right Eye Near:   Left Eye Near:    Bilateral Near:     Physical Exam Vitals and nursing note reviewed.  Constitutional:      General: She is not in acute distress.    Appearance: Normal appearance. She is not ill-appearing, toxic-appearing or diaphoretic.  HENT:     Head: Normocephalic.     Nose: Nose normal.     Mouth/Throat:     Pharynx: Oropharynx is clear.  Eyes:     Conjunctiva/sclera: Conjunctivae normal.  Cardiovascular:     Rate and Rhythm: Normal rate and regular rhythm.  Pulmonary:     Effort: Pulmonary effort is normal.     Breath sounds: Normal breath sounds.  Musculoskeletal:        General: Normal  range of motion.     Cervical back: Normal range of motion.       Back:  Skin:    General: Skin is warm and dry.     Findings: No rash.  Neurological:     Mental Status: She is alert.  Psychiatric:        Mood and Affect: Mood normal.      UC Treatments / Results  Labs (all labs ordered are listed, but only abnormal results are displayed) Labs Reviewed - No data to display  EKG   Radiology DG Chest 2 View  Result Date: 10/17/2019 CLINICAL DATA:  Back pain, shortness of breath, COVID positive EXAM: CHEST - 2 VIEW COMPARISON:  None. FINDINGS: The heart size and mediastinal contours are within normal limits. Both lungs are clear. The visualized skeletal structures are unremarkable. IMPRESSION: No acute abnormality of the lungs. Electronically Signed   By: Lauralyn Primes M.D.   On: 10/17/2019 13:43    Procedures Procedures (including critical care time)  Medications Ordered in UC Medications - No data to display  Initial Impression / Assessment and Plan / UC Course  I have reviewed the triage vital signs and the nursing notes.  Pertinent labs & imaging results that were available during my care of the patient were reviewed by me and considered in my medical decision making (see chart for details).     COVID-19-patient still having body aches from virus.  Still not feeling well Chest x-ray normal We will have her do ibuprofen for pain Work note extended until Monday Final Clinical Impressions(s) / UC Diagnoses   Final diagnoses:  COVID-19     Discharge Instructions     Your x-ray was normal Is most likely continued by aches from the virus. Sending in ibuprofen to take for pain.  600 mg every 8 hours. Work note extended Follow up as needed for continued or worsening symptoms     ED Prescriptions    Medication Sig Dispense Auth. Provider   ibuprofen (ADVIL) 600 MG tablet Take 1 tablet (600 mg total) by  mouth every 8 (eight) hours as needed for moderate pain.  30 tablet Loura Halt A, NP     PDMP not reviewed this encounter.   Loura Halt A, NP 10/19/19 1016

## 2021-05-20 ENCOUNTER — Other Ambulatory Visit: Payer: Self-pay

## 2021-05-20 ENCOUNTER — Ambulatory Visit (HOSPITAL_COMMUNITY)
Admission: EM | Admit: 2021-05-20 | Discharge: 2021-05-20 | Disposition: A | Discharge status: Home / Self Care | Payer: BC Managed Care – PPO | Attending: Internal Medicine | Admitting: Internal Medicine

## 2021-05-20 ENCOUNTER — Encounter (HOSPITAL_COMMUNITY): Payer: Self-pay

## 2021-05-20 DIAGNOSIS — R102 Pelvic and perineal pain: Secondary | ICD-10-CM | POA: Insufficient documentation

## 2021-05-20 DIAGNOSIS — O021 Missed abortion: Secondary | ICD-10-CM | POA: Insufficient documentation

## 2021-05-20 DIAGNOSIS — U071 COVID-19: Secondary | ICD-10-CM | POA: Diagnosis not present

## 2021-05-20 MED ORDER — IBUPROFEN 600 MG PO TABS: 600.0000 mg | ORAL_TABLET | Freq: Three times a day (TID) | ORAL | 0 refills | Status: DC | PRN

## 2021-05-20 NOTE — Discharge Instructions (Signed)
Increase oral fluid intake Take medications as prescribed Return to urgent care if symptoms worsen. Please quarantine until COVID-19 test results are available We will call you with recommendations if labs are abnormal.

## 2021-05-20 NOTE — ED Triage Notes (Signed)
Pt c/o sore throat, headache and chills since yesterday.

## 2021-05-20 NOTE — ED Provider Notes (Signed)
[facility]    CSN: [id number] Arrival date & time: 05/20/21  1351      History   Chief Complaint Chief Complaint  Patient presents with   Sore Throat   Migraine    HPI Gina Li is a 45 y.o. female comes to the urgent care with 1 day history of sore throat, generalized headache, chills and subjective fever.  Patient's symptoms started yesterday and has been persistent.  She denies any nausea or vomiting.  No diarrhea.  She was exposed to COVID-19 positive individual few days ago.  Patient is vaccinated against COVID-19 virus.   HPI  Past Medical History:  Diagnosis Date   Medical history non-contributory     There are no problems to display for this patient.   Past Surgical History:  Procedure Laterality Date   CESAREAN SECTION     x3    OB History     Gravida  6   Para  4   Term  3   Preterm  1   AB  2   Living         SAB  2   IAB      Ectopic      Multiple      Live Births               Home Medications    Prior to Admission medications   Medication Sig Start Date End Date Taking? Authorizing Provider  benzonatate (TESSALON) 100 MG capsule Take 1-2 capsules (100-200 mg total) by mouth 3 (three) times daily as needed. 10/08/19   Wallis Bamberg, PA-C  cetirizine (ZYRTEC ALLERGY) 10 MG tablet Take 1 tablet (10 mg total) by mouth daily. 10/08/19   Wallis Bamberg, PA-C  ibuprofen (ADVIL) 600 MG tablet Take 1 tablet (600 mg total) by mouth every 8 (eight) hours as needed for moderate pain. 05/20/21   Merrilee Jansky, MD  promethazine-dextromethorphan (PROMETHAZINE-DM) 6.25-15 MG/5ML syrup Take 5 mLs by mouth at bedtime as needed for cough. 10/08/19   Wallis Bamberg, PA-C  pseudoephedrine (SUDAFED) 30 MG tablet Take 1 tablet (30 mg total) by mouth every 8 (eight) hours as needed for congestion. 10/08/19   Wallis Bamberg, PA-C  promethazine (PHENERGAN) 25 MG tablet Take 1 tablet (25 mg total) by mouth every 6 (six) hours as needed for nausea  or vomiting. Patient not taking: Reported on 04/08/2015 03/21/15 10/08/19  Clemmons, Elmore Guise, CNM    Family History History reviewed. No pertinent family history.  Social History Social History   Tobacco Use   Smoking status: Never   Smokeless tobacco: Never  Substance Use Topics   Alcohol use: No   Drug use: No     Allergies   Patient has no known allergies.   Review of Systems Review of Systems  Constitutional: Negative.   HENT:  Positive for sore throat. Negative for congestion.   Respiratory: Negative.    Cardiovascular: Negative.   Musculoskeletal:  Positive for arthralgias and myalgias.  Neurological:  Positive for headaches.    Physical Exam Triage Vital Signs ED Triage Vitals  Enc Vitals Group     BP 05/20/21 1603 (!) 160/99     Pulse Rate 05/20/21 1603 91     Resp 05/20/21 1603 18     Temp 05/20/21 1603 98.4 F (36.9 C)     Temp Source 05/20/21 1603 Oral     SpO2 05/20/21 1603 98 %     Weight --  Height --      Head Circumference --      Peak Flow --      Pain Score 05/20/21 1601 0     Pain Loc --      Pain Edu? --      Excl. in GC? --    No data found.  Updated Vital Signs BP (!) 160/99 (BP Location: Left Arm)   Pulse 91   Temp 98.4 F (36.9 C) (Oral)   Resp 18   LMP 04/24/2021   SpO2 98%   Visual Acuity Right Eye Distance:   Left Eye Distance:   Bilateral Distance:    Right Eye Near:   Left Eye Near:    Bilateral Near:     Physical Exam Vitals and nursing note reviewed.  Constitutional:      Appearance: She is well-developed.  HENT:     Right Ear: Tympanic membrane normal.     Left Ear: Tympanic membrane normal.     Mouth/Throat:     Mouth: Mucous membranes are moist.     Pharynx: No posterior oropharyngeal erythema.  Neurological:     Mental Status: She is alert.     UC Treatments / Results  Labs (all labs ordered are listed, but only abnormal results are displayed) Labs Reviewed  SARS CORONAVIRUS 2 (TAT 6-24 HRS)     EKG   Radiology No results found.  Procedures Procedures (including critical care time)  Medications Ordered in UC Medications - No data to display  Initial Impression / Assessment and Plan / UC Course  I have reviewed the triage vital signs and the nursing notes.  Pertinent labs & imaging results that were available during my care of the patient were reviewed by me and considered in my medical decision making (see chart for details).     1.  COVID-19 virus infection: COVID-19 PCR test has been sent Ibuprofen as needed for fever and/or body aches Please quarantine until COVID-19 test results available Return to urgent care if your symptoms worsen. Final Clinical Impressions(s) / UC Diagnoses   Final diagnoses:  COVID-19  Missed abortion  Pelvic cramping     Discharge Instructions      Increase oral fluid intake Take medications as prescribed Return to urgent care if symptoms worsen. Please quarantine until COVID-19 test results are available We will call you with recommendations if labs are abnormal.     ED Prescriptions     Medication Sig Dispense Auth. Provider   ibuprofen (ADVIL) 600 MG tablet Take 1 tablet (600 mg total) by mouth every 8 (eight) hours as needed for moderate pain. 30 tablet Roanna Reaves, Britta Mccreedy, MD      PDMP not reviewed this encounter.   Merrilee Jansky, MD 05/20/21 [phone]

## 2021-05-21 LAB — SARS CORONAVIRUS 2 (TAT 6-24 HRS): SARS Coronavirus 2: NEGATIVE

## 2021-09-29 ENCOUNTER — Other Ambulatory Visit: Payer: Self-pay

## 2021-09-29 ENCOUNTER — Encounter (HOSPITAL_COMMUNITY): Payer: Self-pay

## 2021-09-29 ENCOUNTER — Ambulatory Visit (HOSPITAL_COMMUNITY)
Admission: EM | Admit: 2021-09-29 | Discharge: 2021-09-29 | Disposition: A | Discharge status: Home / Self Care | Payer: BC Managed Care – PPO | Attending: Internal Medicine | Admitting: Internal Medicine

## 2021-09-29 DIAGNOSIS — J028 Acute pharyngitis due to other specified organisms: Secondary | ICD-10-CM | POA: Diagnosis not present

## 2021-09-29 DIAGNOSIS — B9789 Other viral agents as the cause of diseases classified elsewhere: Secondary | ICD-10-CM | POA: Diagnosis not present

## 2021-09-29 DIAGNOSIS — J029 Acute pharyngitis, unspecified: Secondary | ICD-10-CM

## 2021-09-29 DIAGNOSIS — Z20822 Contact with and (suspected) exposure to covid-19: Secondary | ICD-10-CM | POA: Diagnosis not present

## 2021-09-29 LAB — POCT RAPID STREP A, ED / UC: Streptococcus, Group A Screen (Direct): NEGATIVE

## 2021-09-29 MED ORDER — LIDOCAINE VISCOUS HCL 2 % MT SOLN: 15.0000 mL | OROMUCOSAL | 0 refills | Status: DC | PRN

## 2021-09-29 MED ORDER — IBUPROFEN 600 MG PO TABS: 600.0000 mg | ORAL_TABLET | Freq: Four times a day (QID) | ORAL | 0 refills | Status: DC | PRN

## 2021-09-29 NOTE — ED Triage Notes (Signed)
Pt presents for sore throat x 3 days. She is not taking anything for pain.

## 2021-09-29 NOTE — Discharge Instructions (Addendum)
Warm Salt water gargle  Ibuprofen as needed for throat pain We will call you  with recommendations if labs are abnormal Please take medications as prescribed

## 2021-09-30 LAB — SARS CORONAVIRUS 2 (TAT 6-24 HRS): SARS Coronavirus 2: NEGATIVE

## 2021-09-30 NOTE — ED Provider Notes (Signed)
[facility]    CSN: [id number] Arrival date & time: 09/29/21  1110      History   Chief Complaint Chief Complaint  Patient presents with   Sore Throat    HPI Gina Li is a 45 y.o. female is brought to the urgent care for 3-day history of fever.  Patient's symptoms started insidiously and has been persistent.  She has not tried any over-the-counter medications.  No nausea vomiting or diarrhea.  Patient works in a Merchandiser, retail.  He endorses generalized body aches.  No shortness of breath or wheezing.  She has a cough which is nonproductive of sputum.  Patient endorses sore throat. Past Medical History:  Diagnosis Date   Medical history non-contributory     There are no problems to display for this patient.   Past Surgical History:  Procedure Laterality Date   CESAREAN SECTION     x3    OB History     Gravida  6   Para  4   Term  3   Preterm  1   AB  2   Living         SAB  2   IAB      Ectopic      Multiple      Live Births               Home Medications    Prior to Admission medications   Medication Sig Start Date End Date Taking? Authorizing Provider  ibuprofen (ADVIL) 600 MG tablet Take 1 tablet (600 mg total) by mouth every 6 (six) hours as needed. 09/29/21  Yes Coriana Angello, Britta Mccreedy, MD  lidocaine (XYLOCAINE) 2 % solution Use as directed 15 mLs in the mouth or throat as needed for mouth pain. 09/29/21  Yes Rosalia Mcavoy, Britta Mccreedy, MD  cetirizine (ZYRTEC ALLERGY) 10 MG tablet Take 1 tablet (10 mg total) by mouth daily. 10/08/19   Wallis Bamberg, PA-C  promethazine-dextromethorphan (PROMETHAZINE-DM) 6.25-15 MG/5ML syrup Take 5 mLs by mouth at bedtime as needed for cough. 10/08/19   Wallis Bamberg, PA-C  pseudoephedrine (SUDAFED) 30 MG tablet Take 1 tablet (30 mg total) by mouth every 8 (eight) hours as needed for congestion. 10/08/19   Wallis Bamberg, PA-C  promethazine (PHENERGAN) 25 MG tablet Take 1 tablet (25 mg total) by mouth every 6  (six) hours as needed for nausea or vomiting. Patient not taking: Reported on 04/08/2015 03/21/15 10/08/19  Clemmons, Elmore Guise, CNM    Family History No family history on file.  Social History Social History   Tobacco Use   Smoking status: Never   Smokeless tobacco: Never  Substance Use Topics   Alcohol use: No   Drug use: No     Allergies   Patient has no known allergies.   Review of Systems Review of Systems As per HPI  Physical Exam Triage Vital Signs ED Triage Vitals [09/29/21 1248]  Enc Vitals Group     BP (!) 151/83     Pulse Rate 93     Resp 16     Temp 98.3 F (36.8 C)     Temp Source Oral     SpO2 100 %     Weight      Height      Head Circumference      Peak Flow      Pain Score 6     Pain Loc      Pain Edu?  Excl. in GC?    No data found.  Updated Vital Signs BP (!) 151/83 (BP Location: Left Arm)    Pulse 93    Temp 98.3 F (36.8 C) (Oral)    Resp 16    LMP 09/10/2021    SpO2 100%   Visual Acuity Right Eye Distance:   Left Eye Distance:   Bilateral Distance:    Right Eye Near:   Left Eye Near:    Bilateral Near:     Physical Exam Vitals and nursing note reviewed.  Constitutional:      General: She is not in acute distress.    Appearance: She is not ill-appearing.  HENT:     Nose: No rhinorrhea.     Mouth/Throat:     Mouth: Mucous membranes are moist.     Pharynx: No posterior oropharyngeal erythema.  Cardiovascular:     Rate and Rhythm: Normal rate and regular rhythm.     Heart sounds: Normal heart sounds.  Pulmonary:     Effort: Pulmonary effort is normal.     Breath sounds: Normal breath sounds.  Neurological:     Mental Status: She is alert.     UC Treatments / Results  Labs (all labs ordered are listed, but only abnormal results are displayed) Labs Reviewed  SARS CORONAVIRUS 2 (TAT 6-24 HRS)  CULTURE, GROUP A STREP [facility])  POCT RAPID STREP A, ED / UC    EKG   Radiology No results  found.  Procedures Procedures (including critical care time)  Medications Ordered in UC Medications - No data to display  Initial Impression / Assessment and Plan / UC Course  I have reviewed the triage vital signs and the nursing notes.  Pertinent labs & imaging results that were available during my care of the patient were reviewed by me and considered in my medical decision making (see chart for details).     1.  Acute viral pharyngitis: Point-of-care strep is negative Throat cultures have been sent Warm salt water gargle Tylenol/Motrin as needed for pain Lidocaine viscus as needed for throat pain COVID-19 PCR test has been sent. Return precautions given. Final Clinical Impressions(s) / UC Diagnoses   Final diagnoses:  Viral pharyngitis     Discharge Instructions      Warm Salt water gargle  Ibuprofen as needed for throat pain We will call you  with recommendations if labs are abnormal Please take medications as prescribed    ED Prescriptions     Medication Sig Dispense Auth. Provider   lidocaine (XYLOCAINE) 2 % solution Use as directed 15 mLs in the mouth or throat as needed for mouth pain. 100 mL Jaydrian Corpening, Britta Mccreedy, MD   ibuprofen (ADVIL) 600 MG tablet Take 1 tablet (600 mg total) by mouth every 6 (six) hours as needed. 30 tablet Oris Staffieri, Britta Mccreedy, MD      PDMP not reviewed this encounter.   Merrilee Jansky, MD 10/03/21 [phone]

## 2021-10-01 LAB — CULTURE, GROUP A STREP (THRC)

## 2022-05-03 ENCOUNTER — Other Ambulatory Visit: Payer: Self-pay

## 2022-05-03 ENCOUNTER — Encounter (HOSPITAL_COMMUNITY): Payer: Self-pay

## 2022-05-03 ENCOUNTER — Ambulatory Visit (HOSPITAL_COMMUNITY)
Admission: EM | Admit: 2022-05-03 | Discharge: 2022-05-03 | Disposition: A | Discharge status: Home / Self Care | Payer: BC Managed Care – PPO | Attending: Family Medicine | Admitting: Family Medicine

## 2022-05-03 ENCOUNTER — Emergency Department (HOSPITAL_COMMUNITY)
Admission: EM | Admit: 2022-05-03 | Discharge: 2022-05-04 | Disposition: A | Discharge status: Home / Self Care | Payer: BC Managed Care – PPO | Attending: Emergency Medicine | Admitting: Emergency Medicine

## 2022-05-03 ENCOUNTER — Encounter (HOSPITAL_COMMUNITY): Payer: Self-pay | Admitting: *Deleted

## 2022-05-03 DIAGNOSIS — R1084 Generalized abdominal pain: Secondary | ICD-10-CM

## 2022-05-03 DIAGNOSIS — R1013 Epigastric pain: Secondary | ICD-10-CM | POA: Insufficient documentation

## 2022-05-03 LAB — CBC WITH DIFFERENTIAL/PLATELET
Abs Immature Granulocytes: 0.01 10*3/uL (ref 0.00–0.07)
Basophils Absolute: 0 10*3/uL (ref 0.0–0.1)
Basophils Relative: 0 %
Eosinophils Absolute: 0.1 10*3/uL (ref 0.0–0.5)
Eosinophils Relative: 1 %
HCT: 39.1 % (ref 36.0–46.0)
Hemoglobin: 13.5 g/dL (ref 12.0–15.0)
Immature Granulocytes: 0 %
Lymphocytes Relative: 36 %
Lymphs Abs: 2.8 10*3/uL (ref 0.7–4.0)
MCH: 31.5 pg (ref 26.0–34.0)
MCHC: 34.5 g/dL (ref 30.0–36.0)
MCV: 91.1 fL (ref 80.0–100.0)
Monocytes Absolute: 0.7 10*3/uL (ref 0.1–1.0)
Monocytes Relative: 9 %
Neutro Abs: 4.3 10*3/uL (ref 1.7–7.7)
Neutrophils Relative %: 54 %
Platelets: 203 10*3/uL (ref 150–400)
RBC: 4.29 MIL/uL (ref 3.87–5.11)
RDW: 12.9 % (ref 11.5–15.5)
WBC: 7.8 10*3/uL (ref 4.0–10.5)
nRBC: 0 % (ref 0.0–0.2)

## 2022-05-03 LAB — I-STAT CHEM 8, ED
BUN: 10 mg/dL (ref 6–20)
Calcium, Ion: 1.1 mmol/L — ABNORMAL LOW (ref 1.15–1.40)
Chloride: 108 mmol/L (ref 98–111)
Creatinine, Ser: 0.5 mg/dL (ref 0.44–1.00)
Glucose, Bld: 96 mg/dL (ref 70–99)
HCT: 41 % (ref 36.0–46.0)
Hemoglobin: 13.9 g/dL (ref 12.0–15.0)
Potassium: 3.5 mmol/L (ref 3.5–5.1)
Sodium: 140 mmol/L (ref 135–145)
TCO2: 20 mmol/L — ABNORMAL LOW (ref 22–32)

## 2022-05-03 LAB — POCT URINALYSIS DIPSTICK, ED / UC
Bilirubin Urine: NEGATIVE
Glucose, UA: NEGATIVE mg/dL
Ketones, ur: NEGATIVE mg/dL
Leukocytes,Ua: NEGATIVE
Nitrite: NEGATIVE
Protein, ur: NEGATIVE mg/dL
Specific Gravity, Urine: 1.01 (ref 1.005–1.030)
Urobilinogen, UA: 1 mg/dL (ref 0.0–1.0)
pH: 7 (ref 5.0–8.0)

## 2022-05-03 LAB — COMPREHENSIVE METABOLIC PANEL
ALT: 26 U/L (ref 0–44)
AST: 23 U/L (ref 15–41)
Albumin: 3.7 g/dL (ref 3.5–5.0)
Alkaline Phosphatase: 77 U/L (ref 38–126)
Anion gap: 9 (ref 5–15)
BUN: 11 mg/dL (ref 6–20)
CO2: 20 mmol/L — ABNORMAL LOW (ref 22–32)
Calcium: 8.7 mg/dL — ABNORMAL LOW (ref 8.9–10.3)
Chloride: 108 mmol/L (ref 98–111)
Creatinine, Ser: 0.57 mg/dL (ref 0.44–1.00)
GFR, Estimated: >60 mL/min (ref >60)
Glucose, Bld: 97 mg/dL (ref 70–99)
Potassium: 3.4 mmol/L — ABNORMAL LOW (ref 3.5–5.1)
Sodium: 137 mmol/L (ref 135–145)
Total Bilirubin: 1.3 mg/dL — ABNORMAL HIGH (ref 0.3–1.2)
Total Protein: 7.6 g/dL (ref 6.5–8.1)

## 2022-05-03 LAB — POC URINE PREG, ED: Preg Test, Ur: NEGATIVE

## 2022-05-03 LAB — URINALYSIS, ROUTINE W REFLEX MICROSCOPIC
Bilirubin Urine: NEGATIVE
Glucose, UA: NEGATIVE mg/dL
Hgb urine dipstick: NEGATIVE
Ketones, ur: NEGATIVE mg/dL
Leukocytes,Ua: NEGATIVE
Nitrite: NEGATIVE
Protein, ur: NEGATIVE mg/dL
Specific Gravity, Urine: 1.012 (ref 1.005–1.030)
pH: 7 (ref 5.0–8.0)

## 2022-05-03 LAB — I-STAT BETA HCG BLOOD, ED (MC, WL, AP ONLY): I-stat hCG, quantitative: <5 m[IU]/mL (ref <5)

## 2022-05-03 NOTE — ED Triage Notes (Addendum)
Kinyarwanda interpreter used for triage.   Pt reports suprapubic abdominal pain since yesterday with a yellowish discharge. Denies n/v/d/constipation.

## 2022-05-03 NOTE — ED Notes (Signed)
Unsuccessful IV attempt.

## 2022-05-03 NOTE — ED Triage Notes (Signed)
Patient started with abdominal pain yesterday. Patient states has taken no meds. Yellow vaginal discharge started yesterday.  Here today with son.

## 2022-05-03 NOTE — ED Provider Triage Note (Signed)
Emergency Medicine Provider Triage Evaluation Note  Karon Cotterill , a 46 y.o. female  was evaluated in triage.  Pt complains of lower abdominal pain since yesterday but has been feeling better. She reports she is seeing a white/yellow discharge from her vagina whenever she urinates. She reports she is having to change her underwear every 12 hours because of the amount of discharge. Denies any pain on urination. Denies any nausea, vomiting, or fevers.   Review of Systems  Positive:  Negative:   Physical Exam  There were no vitals taken for this visit. Gen:   Awake, no distress , appears uncomfortable.  Resp:  Normal effort  MSK:   Moves extremities without difficulty  Other:  Diffuse abdominal tenderness. Soft, no guarding or rebound. No CVA tenderness.   Medical Decision Making  Medically screening exam initiated at 8:44 PM.  Appropriate orders placed.  Krithika Wasilewski was informed that the remainder of the evaluation will be completed by another provider, this initial triage assessment does not replace that evaluation, and the importance of remaining in the ED until their evaluation is complete.  Denies any drug allergies or allergies to contrast.  Translator service used.    Achille Rich, [place] 05/03/22 2049

## 2022-05-04 ENCOUNTER — Emergency Department (HOSPITAL_COMMUNITY): Payer: BC Managed Care – PPO

## 2022-05-04 LAB — LIPASE, BLOOD: Lipase: 30 U/L (ref 11–51)

## 2022-05-04 MED ORDER — ALUM & MAG HYDROXIDE-SIMETH 200-200-20 MG/5ML PO SUSP
30.0000 mL | Freq: Once | ORAL | Status: AC
  Administered 2022-05-04: 30 mL via ORAL
  Filled 2022-05-04: qty 30

## 2022-05-04 MED ORDER — MORPHINE SULFATE (PF) 4 MG/ML IV SOLN
4.0000 mg | Freq: Once | INTRAVENOUS | Status: AC
  Administered 2022-05-04: 4 mg via INTRAVENOUS
  Filled 2022-05-04: qty 1

## 2022-05-04 MED ORDER — IOHEXOL 350 MG/ML SOLN
100.0000 mL | Freq: Once | INTRAVENOUS | Status: AC | PRN
  Administered 2022-05-04: 100 mL via INTRAVENOUS

## 2022-05-04 MED ORDER — FAMOTIDINE 40 MG PO TABS: 40.0000 mg | ORAL_TABLET | Freq: Every day | ORAL | 0 refills | Status: DC

## 2022-05-04 NOTE — ED Notes (Signed)
Patient transported to MRI 

## 2022-05-04 NOTE — ED Notes (Signed)
ED Provider at bedside. 

## 2022-05-04 NOTE — ED Notes (Signed)
Got patient into a gown on the monitor patient is resting with call bell in reach and family at bedside ?

## 2022-05-04 NOTE — Discharge Instructions (Signed)
Twagusuzumye uyu munsi mu ishami ryihutirwa [place] Ntabwo twabonye impamvu iteye [place] yo [place] Kwipimisha laboratoire hamwe na CT scan byari bibi kubibazo biteye akaga  Nyamuneka gerageza gufata iyi miti igabanya ubukana kuko ububabare bwawe bushobora kuba bujyanye no gutwika igifu bivuye kuri acide nyinshi.  [place]un[place]a subira mu ishami ryihutirwa niba urwaye umuriro, [place], inkari zibabaza, kubabara mu nda, cyangwa ibindi byose bijyanye nibimenyetso  We evaluated you today in the emergency department for your abdominal pain.  We did not find a dangerous cause of your abdominal pain.  Your lab testing and CT scans were negative for dangerous problems  Please try to take this antiacid medication as your pain may be related to inflammation of the stomach from too much stomach acid.  Please return to the emergency department if you develop fevers, vomiting, painful urination, severe abdominal pain, or any other concerning symptoms

## 2022-05-04 NOTE — ED Provider Notes (Signed)
[facility]   [id number] 05/03/22 Arrival Time: 1834  ASSESSMENT & PLAN:  1. Generalized abdominal pain    Reports significant TTP on exam. U/A without signs of infection. After discussion, prefers ED evaluation. To ED. Stable upon discharge.   Follow-up Information     Go to  [facility] EMERGENCY DEPARTMENT.   Specialty: Emergency Medicine Contact information: [address] [id number] [place] [address] [phone]                Reviewed expectations re: course of current medical issues. Questions answered. Outlined signs and symptoms indicating need for more acute intervention. Patient verbalized understanding. After Visit Summary given.   SUBJECTIVE: History from: patient and family. Son is interpreting. Declines interpreter. Gina Li is a 46 y.o. female who presents with complaint of generalized abd pain; abrupt onset yesterday. No spec urinary symptoms. Does report occas vag d/c. Does not express worry over STI. Afebrile. Normal PO intake without n/v/d. NO fever reported. Ambulatory here.   No LMP recorded. (Menstrual status: Other).  Past Surgical History:  Procedure Laterality Date   CESAREAN SECTION     x3     OBJECTIVE:  BP (!) 133/90 (BP Location: Right Arm)   Pulse 71   Temp 98.5 F (36.9 C)   Resp 16   SpO2 98%    General appearance: alert, oriented, no acute distress HEENT: [place]; AT; oropharynx moist Lungs: unlabored respirations Abdomen: soft; without distention; generalized TTP, pulls away with exam; normal bowel sounds; without masses or organomegaly; without guarding or rebound tenderness Back: without reported CVA tenderness; FROM at waist Extremities: without LE edema; symmetrical; without gross deformities Skin: warm and dry Neurologic: normal gait Psychological: alert and cooperative; normal mood and affect  Labs: Results for orders placed or performed during the  hospital encounter of 05/03/22  POC Urinalysis dipstick  Result Value Ref Range   Glucose, UA NEGATIVE NEGATIVE mg/dL   Bilirubin Urine NEGATIVE NEGATIVE   Ketones, ur NEGATIVE NEGATIVE mg/dL   Specific Gravity, Urine 1.010 1.005 - 1.030   Hgb urine dipstick TRACE (A) NEGATIVE   pH 7.0 5.0 - 8.0   Protein, ur NEGATIVE NEGATIVE mg/dL   Urobilinogen, UA 1.0 0.0 - 1.0 mg/dL   Nitrite NEGATIVE NEGATIVE   Leukocytes,Ua NEGATIVE NEGATIVE  POC urine pregnancy  Result Value Ref Range   Preg Test, Ur NEGATIVE NEGATIVE   Labs Reviewed  POCT URINALYSIS DIPSTICK, ED / UC - Abnormal; Notable for the following components:      Result Value   Hgb urine dipstick TRACE (*)    All other components within normal limits  POC URINE PREG, ED     No Known Allergies                                             Past Medical History:  Diagnosis Date   Medical history non-contributory     Social History   Socioeconomic History   Marital status: Widowed    Spouse name: Not on file   Number of children: Not on file   Years of education: Not on file   Highest education level: Not on file  Occupational History   Not on file  Tobacco Use   Smoking status: Never   Smokeless tobacco: Never  Substance and Sexual Activity   Alcohol use: No  Drug use: No   Sexual activity: Not on file  Other Topics Concern   Not on file  Social History Narrative   Not on file   Social Determinants of Health   Financial Resource Strain: Not on file  Food Insecurity: Not on file  Transportation Needs: Not on file  Physical Activity: Not on file  Stress: Not on file  Social Connections: Not on file  Intimate Partner Violence: Not on file    No family history on file.   Mardella Layman, MD 05/04/22 [phone]

## 2022-05-04 NOTE — ED Provider Notes (Signed)
[facility] EMERGENCY DEPARTMENT Provider Note   CSN: [id number] Arrival date & time: 05/03/22  2003     History Chief Complaint  Patient presents with   Abdominal Pain    Cristalle Krahn is a 46 y.o. female without significant past medical history presenting to the emergency department with abdominal pain.  Patient reports 1 day of abdominal pain which is diffuse but possibly worse in the epigastric region.  She denies associated symptoms such as nausea, vomiting, diarrhea, constipation.  Nothing improves or worsens her pain.  She denies chest pain, shortness of breath, fevers, chills, dysuria.  Denies vaginal bleeding  Abdominal Pain  Kinyarwanda interpreter used     Home Medications Prior to Admission medications   Medication Sig Start Date End Date Taking? Authorizing Provider  famotidine (PEPCID) 40 MG tablet Take 1 tablet (40 mg total) by mouth daily. 05/04/22 06/03/22 Yes Lonell Grandchild, MD  cetirizine (ZYRTEC ALLERGY) 10 MG tablet Take 1 tablet (10 mg total) by mouth daily. 10/08/19   Wallis Bamberg, PA-C  ibuprofen (ADVIL) 600 MG tablet Take 1 tablet (600 mg total) by mouth every 6 (six) hours as needed. 09/29/21   Lamptey, Britta Mccreedy, MD  lidocaine (XYLOCAINE) 2 % solution Use as directed 15 mLs in the mouth or throat as needed for mouth pain. 09/29/21   Merrilee Jansky, MD  promethazine-dextromethorphan (PROMETHAZINE-DM) 6.25-15 MG/5ML syrup Take 5 mLs by mouth at bedtime as needed for cough. 10/08/19   Wallis Bamberg, PA-C  pseudoephedrine (SUDAFED) 30 MG tablet Take 1 tablet (30 mg total) by mouth every 8 (eight) hours as needed for congestion. 10/08/19   Wallis Bamberg, PA-C  promethazine (PHENERGAN) 25 MG tablet Take 1 tablet (25 mg total) by mouth every 6 (six) hours as needed for nausea or vomiting. Patient not taking: Reported on 04/08/2015 03/21/15 10/08/19  Rhea Pink, CNM      Allergies    Patient has no known allergies.    Review of Systems   Review  of Systems  Gastrointestinal:  Positive for abdominal pain.  See HPI  Physical Exam Updated Vital Signs BP 137/82 (BP Location: Right Arm)   Pulse 71   Temp 97.9 F (36.6 C) (Oral)   Resp 18   Ht 5\' 8"  (1.727 m)   Wt 104.3 kg   SpO2 99%   BMI 34.97 kg/m  Physical Exam Constitutional:      General: She is not in acute distress.    Appearance: She is well-developed.  HENT:     Head: Normocephalic and atraumatic.     Mouth/Throat:     Mouth: Mucous membranes are moist.  Eyes:     Pupils: Pupils are equal, round, and reactive to light.  Cardiovascular:     Rate and Rhythm: Normal rate and regular rhythm.     Heart sounds: No murmur heard. Pulmonary:     Effort: Pulmonary effort is normal. No respiratory distress.     Breath sounds: Normal breath sounds.  Abdominal:     General: Abdomen is flat.     Palpations: Abdomen is soft.     Tenderness: There is abdominal tenderness (mild diffuse tenderness).  Musculoskeletal:        General: No tenderness.     Right lower leg: No edema.     Left lower leg: No edema.  Skin:    General: Skin is warm and dry.  Neurological:     General: No focal deficit present.  Mental Status: She is alert. Mental status is at baseline.  Psychiatric:        Mood and Affect: Mood normal.        Behavior: Behavior normal.     ED Results / Procedures / Treatments   Labs (all labs ordered are listed, but only abnormal results are displayed) Labs Reviewed  COMPREHENSIVE METABOLIC PANEL - Abnormal; Notable for the following components:      Result Value   Potassium 3.4 (*)    CO2 20 (*)    Calcium 8.7 (*)    Total Bilirubin 1.3 (*)    All other components within normal limits  I-STAT CHEM 8, ED - Abnormal; Notable for the following components:   Calcium, Ion 1.10 (*)    TCO2 20 (*)    All other components within normal limits  CBC WITH DIFFERENTIAL/PLATELET  URINALYSIS, ROUTINE W REFLEX MICROSCOPIC  LIPASE, BLOOD  I-STAT BETA HCG  BLOOD, ED ([facility], [facility], [facility] ONLY)    EKG None  Radiology CT ABDOMEN PELVIS W CONTRAST  Result Date: 05/04/2022 CLINICAL DATA:  Lower abdominal pain and vaginal discharge. EXAM: CT ABDOMEN AND PELVIS WITH CONTRAST TECHNIQUE: Multidetector CT imaging of the abdomen and pelvis was performed using the standard protocol following bolus administration of intravenous contrast. RADIATION DOSE REDUCTION: This exam was performed according to the departmental dose-optimization program which includes automated exposure control, adjustment of the mA and/or kV according to patient size and/or use of iterative reconstruction technique. CONTRAST:  OMNIPAQUE IOHEXOL 350 MG/ML SOLN COMPARISON:  None Available. FINDINGS: Lower chest: No acute abnormality. Hepatobiliary: No focal liver abnormality is seen. No gallstones, gallbladder wall thickening, or biliary dilatation. Pancreas: Unremarkable. No pancreatic ductal dilatation or surrounding inflammatory changes. Spleen: Normal in size without focal abnormality. Adrenals/Urinary Tract: Adrenal glands are unremarkable. Kidneys are normal, without renal calculi, focal lesion, or hydronephrosis. Bladder is unremarkable. Stomach/Bowel: Stomach is within normal limits. Appendix appears normal. No evidence of bowel wall thickening, distention, or inflammatory changes. Vascular/Lymphatic: No significant vascular findings are present. No enlarged abdominal or pelvic lymph nodes. Reproductive: Uterus and bilateral adnexa are unremarkable. Other: No free fluid or pneumoperitoneum. Musculoskeletal: No acute or significant osseous findings. IMPRESSION: 1. No acute intra-abdominal process. Electronically Signed   By: Obie Dredge M.D.   On: 05/04/2022 10:31    Procedures Procedures    Medications Ordered in ED Medications  iohexol (OMNIPAQUE) 350 MG/ML injection 100 mL (100 mLs Intravenous Contrast Given 05/04/22 1016)  morphine (PF) 4 MG/ML injection 4 mg (4 mg Intravenous Given  05/04/22 1330)  alum & mag hydroxide-simeth (MAALOX/MYLANTA) 200-200-20 MG/5ML suspension 30 mL (30 mLs Oral Given 05/04/22 1330)    ED Course/ Medical Decision Making/ A&P                           Medical Decision Making Risk OTC drugs. Prescription drug management.   46 year old presenting to the emergency department with abdominal pain.  Patient well-appearing, vital signs reassuring  Differential includes functional abdominal pain, gastritis.  Work-up overall reassuring, CT scan without evidence of appendicitis, cholecystitis, pancreatitis.  No sign of obstruction.  Labs overall reassuring with no elevated WBC count.  Low concern for aortic pathology.  Urinalysis reassuring.  Patient does report symptoms improved mildly with Maalox and morphine although she received both at the same time.  Given reassuring work-up and reassuring CT scan without clear cause, will initiate Pepcid, and advise close follow-up with primary physician.  Will discharge patient to home. All questions answered. Patient comfortable with plan of discharge. Return precautions discussed with patient and specified on the after visit summary.          Final Clinical Impression(s) / ED Diagnoses Final diagnoses:  Epigastric pain    Rx / DC Orders ED Discharge Orders          Ordered    famotidine (PEPCID) 40 MG tablet  Daily        05/04/22 1445              Lonell Grandchild, MD 05/04/22 1620

## 2022-08-21 ENCOUNTER — Ambulatory Visit (INDEPENDENT_AMBULATORY_CARE_PROVIDER_SITE_OTHER): Payer: BC Managed Care – PPO

## 2022-08-21 ENCOUNTER — Encounter (HOSPITAL_COMMUNITY): Payer: Self-pay

## 2022-08-21 ENCOUNTER — Ambulatory Visit (HOSPITAL_COMMUNITY)
Admission: EM | Admit: 2022-08-21 | Discharge: 2022-08-21 | Disposition: A | Discharge status: Home / Self Care | Payer: BC Managed Care – PPO | Attending: Internal Medicine | Admitting: Internal Medicine

## 2022-08-21 DIAGNOSIS — M25512 Pain in left shoulder: Secondary | ICD-10-CM | POA: Diagnosis not present

## 2022-08-21 DIAGNOSIS — R0789 Other chest pain: Secondary | ICD-10-CM | POA: Diagnosis not present

## 2022-08-21 DIAGNOSIS — R079 Chest pain, unspecified: Secondary | ICD-10-CM | POA: Diagnosis not present

## 2022-08-21 MED ORDER — IBUPROFEN 800 MG PO TABS
800.0000 mg | ORAL_TABLET | Freq: Once | ORAL | Status: AC
  Administered 2022-08-21: 800 mg via ORAL

## 2022-08-21 MED ORDER — BACLOFEN 10 MG PO TABS: 10.0000 mg | ORAL_TABLET | Freq: Three times a day (TID) | ORAL | 0 refills | Status: AC

## 2022-08-21 MED ORDER — IBUPROFEN 800 MG PO TABS
ORAL_TABLET | ORAL | Status: AC
  Filled 2022-08-21: qty 1

## 2022-08-21 MED ORDER — IBUPROFEN 600 MG PO TABS: 600.0000 mg | ORAL_TABLET | Freq: Four times a day (QID) | ORAL | 0 refills | Status: AC | PRN

## 2022-08-21 NOTE — Discharge Instructions (Addendum)
You have been evaluated in the today for your shoulder and chest pain. Your pain is most likely muscle strain which will improve on its own with time.   Take ibuprofen 600 mg with food every 6 hours for the next 2-3 days consistently to treat pain and inflammation, then as needed. Do not take any other NSAID containing medicine when taking ibuprofen like naproxen, Advil, Aleve, motrin, aspirin, Voltaren, diclofenac, and other medicines. Take this medication with a snack to avoid stomach upset.   You may also take baclofen muscle relaxer every 8 hours as needed for muscle spasm.  Do not take this medication and drive or drink alcohol as it can make you sleepy.  Mainly use this medicine at nighttime as needed.  Apply heat and perform gentle range of motion exercises to the area of greatest pain to prevent muscle stiffness and provide further pain relief.   Follow-up with your primary care provider or return to urgent care if your symptoms do not improve in the next 3 to 4 days with medications and interventions recommended today.  If you develop any new or worsening symptoms, please return to urgent care.  If your symptoms are severe, please go to the emergency room.  I hope you feel better!
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
## 2023-02-01 ENCOUNTER — Ambulatory Visit (HOSPITAL_COMMUNITY)
Admission: EM | Admit: 2023-02-01 | Discharge: 2023-02-01 | Disposition: A | Discharge status: Home / Self Care | Payer: BC Managed Care – PPO | Attending: Sports Medicine | Admitting: Sports Medicine

## 2023-02-01 ENCOUNTER — Encounter (HOSPITAL_COMMUNITY): Payer: Self-pay

## 2023-02-01 ENCOUNTER — Other Ambulatory Visit (HOSPITAL_COMMUNITY): Payer: Self-pay | Admitting: Internal Medicine

## 2023-02-01 DIAGNOSIS — N644 Mastodynia: Secondary | ICD-10-CM | POA: Diagnosis not present

## 2023-02-01 DIAGNOSIS — N6320 Unspecified lump in the left breast, unspecified quadrant: Secondary | ICD-10-CM

## 2023-02-01 DIAGNOSIS — N6321 Unspecified lump in the left breast, upper outer quadrant: Secondary | ICD-10-CM

## 2023-02-01 NOTE — ED Triage Notes (Signed)
Pt states via interpretor breast pain on and off for the past 2 weeks.

## 2023-02-01 NOTE — Discharge Instructions (Addendum)
For your painful breast lump we need to get some further imaging.  I have placed an order and provided the phone number to call above.  If they have not called you after 1 week I recommend you call and make an appointment.  ZOXWRUEAVW byawe bibabaza Pecola Lawless andi mashusho.  Nashyizeho itegeko kandi ntanga nimero ya terefone yo guhamagara hejuru.  Niba bataguhamagaye nyuma yicyumweru 1 ndagusaba guhamagara no [place].
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
## 2023-02-14 ENCOUNTER — Encounter: Payer: Self-pay | Admitting: Internal Medicine

## 2023-02-15 ENCOUNTER — Ambulatory Visit
Admission: RE | Admit: 2023-02-15 | Discharge: 2023-02-15 | Disposition: A | Discharge status: Home / Self Care | Payer: BC Managed Care – PPO | Source: Ambulatory Visit | Attending: Internal Medicine | Admitting: Internal Medicine

## 2023-02-15 ENCOUNTER — Other Ambulatory Visit (HOSPITAL_COMMUNITY): Payer: Self-pay | Admitting: Internal Medicine

## 2023-02-15 DIAGNOSIS — N6489 Other specified disorders of breast: Secondary | ICD-10-CM | POA: Diagnosis not present

## 2023-02-15 DIAGNOSIS — N6002 Solitary cyst of left breast: Secondary | ICD-10-CM | POA: Diagnosis not present

## 2023-02-15 DIAGNOSIS — N6001 Solitary cyst of right breast: Secondary | ICD-10-CM | POA: Diagnosis not present

## 2023-02-15 DIAGNOSIS — N6320 Unspecified lump in the left breast, unspecified quadrant: Secondary | ICD-10-CM

## 2023-05-21 ENCOUNTER — Other Ambulatory Visit: Payer: BC Managed Care – PPO

## 2023-11-06 ENCOUNTER — Encounter (HOSPITAL_COMMUNITY): Payer: Self-pay | Admitting: Emergency Medicine

## 2023-11-06 ENCOUNTER — Ambulatory Visit (HOSPITAL_COMMUNITY)
Admission: EM | Admit: 2023-11-06 | Discharge: 2023-11-06 | Disposition: A | Discharge status: Home / Self Care | Payer: Medicaid Other | Attending: Family Medicine | Admitting: Family Medicine

## 2023-11-06 ENCOUNTER — Other Ambulatory Visit: Payer: Self-pay

## 2023-11-06 DIAGNOSIS — J111 Influenza due to unidentified influenza virus with other respiratory manifestations: Secondary | ICD-10-CM

## 2023-11-06 MED ORDER — OSELTAMIVIR PHOSPHATE 75 MG PO CAPS: 75.0000 mg | ORAL_CAPSULE | Freq: Two times a day (BID) | ORAL | 0 refills | Status: AC

## 2023-11-06 NOTE — Discharge Instructions (Addendum)
Most likely you have influenza a.  Alternate tylenol/ibuprofen as label directed. Take tamiflu as directed. Push fluids. If you have new or worsening issues or concerns(chest pain,palpitations, shortness of breath, go to Er for further evaluation).

## 2023-11-06 NOTE — ED Triage Notes (Signed)
Cough and congestion for 3 days.  Symptoms are worsening.  Denies fever.  Frequent congested cough

## 2023-11-06 NOTE — ED Provider Notes (Signed)
 [facility]    CSN: [id number] Arrival date & time: 11/06/23  1338      History   Chief Complaint Chief Complaint  Patient presents with   Cough    HPI Gina Li is a 48 y.o. female.   48 year old female pt, Gina Li, presents to urgent care with son for evaluation of cough and congestion that started 2 nights prior.  Patient reports symptoms are worsening.  Son has similar signs and symptoms.  No meds taken prior to arrival  The history is provided by the patient. A language interpreter was used (Angelique, interpreter).    Past Medical History:  Diagnosis Date   Medical history non-contributory     Patient Active Problem List   Diagnosis Date Noted   Influenza 11/06/2023    Past Surgical History:  Procedure Laterality Date   CESAREAN SECTION     x3    OB History     Gravida  6   Para  4   Term  3   Preterm  1   AB  2   Living         SAB  2   IAB      Ectopic      Multiple      Live Births               Home Medications    Prior to Admission medications   Medication Sig Start Date End Date Taking? Authorizing Provider  oseltamivir  (TAMIFLU ) 75 MG capsule Take 1 capsule (75 mg total) by mouth every 12 (twelve) hours. 11/06/23  Yes Shauntay Brunelli, Rilla, NP  baclofen  (LIORESAL ) 10 MG tablet Take 1 tablet (10 mg total) by mouth 3 (three) times daily. Patient not taking: Reported on 11/06/2023 08/21/22   Enedelia Dorna HERO, FNP  ibuprofen  (ADVIL ) 600 MG tablet Take 1 tablet (600 mg total) by mouth every 6 (six) hours as needed. Patient not taking: Reported on 11/06/2023 08/21/22   Enedelia Dorna HERO, FNP  promethazine  (PHENERGAN ) 25 MG tablet Take 1 tablet (25 mg total) by mouth every 6 (six) hours as needed for nausea or vomiting. Patient not taking: Reported on 04/08/2015 03/21/15 10/08/19  Clemmons, Katheryn LABOR, CNM    Family History Family History  Problem Relation Age of Onset   Uterine cancer Mother      Social History Social History   Tobacco Use   Smoking status: Never   Smokeless tobacco: Never  Vaping Use   Vaping status: Never Used  Substance Use Topics   Alcohol use: No   Drug use: No     Allergies   Patient has no known allergies.   Review of Systems Review of Systems  Constitutional:  Positive for fever.  HENT:  Positive for congestion.   Respiratory:  Positive for cough.   All other systems reviewed and are negative.    Physical Exam Triage Vital Signs ED Triage Vitals  Encounter Vitals Group     BP 11/06/23 1500 136/88     Systolic BP Percentile --      Diastolic BP Percentile --      Pulse Rate 11/06/23 1500 (!) 115     Resp 11/06/23 1500 (!) 28     Temp 11/06/23 1500 99.1 F (37.3 C)     Temp Source 11/06/23 1500 Oral     SpO2 11/06/23 1500 96 %     Weight --      Height --  Head Circumference --      Peak Flow --      Pain Score 11/06/23 1457 6     Pain Loc --      Pain Education --      Exclude from Growth Chart --    No data found.  Updated Vital Signs BP 136/88 (BP Location: Right Arm) Comment (BP Location): large cuff  Pulse (!) 115   Temp 99.1 F (37.3 C) (Oral)   Resp (!) 28   SpO2 96%   Visual Acuity Right Eye Distance:   Left Eye Distance:   Bilateral Distance:    Right Eye Near:   Left Eye Near:    Bilateral Near:     Physical Exam Vitals and nursing note reviewed.  Constitutional:      General: She is not in acute distress.    Appearance: She is well-developed.  HENT:     Head: Normocephalic.     Right Ear: Tympanic membrane is retracted.     Left Ear: Tympanic membrane is retracted.     Nose: Congestion present.     Mouth/Throat:     Lips: Pink.     Mouth: Mucous membranes are moist.     Pharynx: Oropharynx is clear.  Eyes:     General: Lids are normal.     Conjunctiva/sclera: Conjunctivae normal.     Pupils: Pupils are equal, round, and reactive to light.  Neck:     Trachea: No tracheal  deviation.  Cardiovascular:     Rate and Rhythm: Regular rhythm.     Pulses: Normal pulses.     Heart sounds: Normal heart sounds. No murmur heard. Pulmonary:     Effort: Pulmonary effort is normal.     Breath sounds: Normal breath sounds.  Abdominal:     General: Bowel sounds are normal.     Palpations: Abdomen is soft.     Tenderness: There is no abdominal tenderness.  Musculoskeletal:        General: Normal range of motion.     Cervical back: Normal range of motion.  Lymphadenopathy:     Cervical: No cervical adenopathy.  Skin:    General: Skin is warm and dry.     Findings: No rash.  Neurological:     General: No focal deficit present.     Mental Status: She is alert and oriented to person, place, and time.     GCS: GCS eye subscore is 4. GCS verbal subscore is 5. GCS motor subscore is 6.  Psychiatric:        Speech: Speech normal.        Behavior: Behavior normal. Behavior is cooperative.      UC Treatments / Results  Labs (all labs ordered are listed, but only abnormal results are displayed) Labs Reviewed - No data to display  EKG   Radiology No results found.  Procedures Procedures (including critical care time)  Medications Ordered in UC Medications - No data to display  Initial Impression / Assessment and Plan / UC Course  I have reviewed the triage vital signs and the nursing notes.  Pertinent labs & imaging results that were available during my care of the patient were reviewed by me and considered in my medical decision making (see chart for details).    Discussed exam findings and plan of care with patient, scripted Tamiflu  as son is positive for flu A ,strict go to ER precautions given.   Patient verbalized understanding to this provider.  Ddx: Influenza A, flulike illness, allergies Final Clinical Impressions(s) / UC Diagnoses   Final diagnoses:  Influenza     Discharge Instructions      Most likely you have influenza a.  Alternate  tylenol /ibuprofen  as label directed. Take tamiflu  as directed. Push fluids. If you have new or worsening issues or concerns(chest pain,palpitations, shortness of breeath, go to Er for further evaluation).      ED Prescriptions     Medication Sig Dispense Auth. Provider   oseltamivir  (TAMIFLU ) 75 MG capsule Take 1 capsule (75 mg total) by mouth every 12 (twelve) hours. 10 capsule Jud Fanguy, NP      PDMP not reviewed this encounter.   Aminta Loose, NP 11/06/23 1554
